# Patient Record
Sex: Female | Born: 1990 | Race: White | Hispanic: No | Marital: Single | Smoking: Current every day smoker
Health system: Southern US, Community
[De-identification: ages and names within clinical notes are randomized; demographics above are authoritative.]

## PROBLEM LIST (undated history)

## (undated) ENCOUNTER — Inpatient Hospital Stay (HOSPITAL_COMMUNITY): Payer: Self-pay

## (undated) ENCOUNTER — Emergency Department (HOSPITAL_COMMUNITY): Admission: EM | Payer: No Typology Code available for payment source | Source: Home / Self Care

## (undated) DIAGNOSIS — O1420 HELLP syndrome (HELLP), unspecified trimester: Secondary | ICD-10-CM

## (undated) DIAGNOSIS — O24419 Gestational diabetes mellitus in pregnancy, unspecified control: Secondary | ICD-10-CM

## (undated) DIAGNOSIS — R110 Nausea: Secondary | ICD-10-CM

## (undated) HISTORY — DX: Nausea: R11.0

## (undated) HISTORY — PX: ANKLE SURGERY: SHX546

## (undated) HISTORY — PX: FRACTURE SURGERY: SHX138

## (undated) HISTORY — PX: TUBAL LIGATION: SHX77

---

## 2011-03-08 ENCOUNTER — Encounter: Payer: Self-pay | Admitting: Emergency Medicine

## 2011-03-08 ENCOUNTER — Emergency Department (HOSPITAL_COMMUNITY)
Admission: EM | Admit: 2011-03-08 | Discharge: 2011-03-09 | Disposition: A | Discharge status: Home / Self Care | Payer: BC Managed Care – PPO | Attending: Emergency Medicine | Admitting: Emergency Medicine

## 2011-03-08 DIAGNOSIS — R0602 Shortness of breath: Secondary | ICD-10-CM | POA: Insufficient documentation

## 2011-03-08 DIAGNOSIS — J45909 Unspecified asthma, uncomplicated: Secondary | ICD-10-CM | POA: Insufficient documentation

## 2011-03-08 DIAGNOSIS — R05 Cough: Secondary | ICD-10-CM | POA: Insufficient documentation

## 2011-03-08 DIAGNOSIS — R059 Cough, unspecified: Secondary | ICD-10-CM | POA: Insufficient documentation

## 2011-03-08 MED ORDER — ALBUTEROL SULFATE (5 MG/ML) 0.5% IN NEBU
2.5000 mg | INHALATION_SOLUTION | Freq: Once | RESPIRATORY_TRACT | Status: AC
  Administered 2011-03-08: 5 mg via RESPIRATORY_TRACT
  Filled 2011-03-08: qty 1

## 2011-03-08 NOTE — ED Notes (Signed)
PT. REPORTS SOB WITH DRY COUGH AND WHEEZING ONSET THIS EVENING ,  RAN OUT OFALBUTEROL  MDI .

## 2011-03-09 MED ORDER — ALBUTEROL SULFATE HFA 108 (90 BASE) MCG/ACT IN AERS: 2.0000 | INHALATION_SPRAY | RESPIRATORY_TRACT | Status: DC | PRN

## 2011-03-09 MED ORDER — PREDNISONE 10 MG PO TABS: 40.0000 mg | ORAL_TABLET | Freq: Every day | ORAL | Status: AC

## 2011-03-09 MED ORDER — PREDNISONE 20 MG PO TABS
60.0000 mg | ORAL_TABLET | Freq: Once | ORAL | Status: AC
  Administered 2011-03-09: 60 mg via ORAL
  Filled 2011-03-09: qty 3

## 2011-03-09 MED ORDER — ALBUTEROL SULFATE HFA 108 (90 BASE) MCG/ACT IN AERS
2.0000 | INHALATION_SPRAY | RESPIRATORY_TRACT | Status: DC
  Administered 2011-03-09: 2 via RESPIRATORY_TRACT
  Filled 2011-03-09: qty 6.7

## 2011-03-09 NOTE — ED Provider Notes (Signed)
History     CSN: [id number] Arrival date & time: 03/08/2011 11:05 PM   First MD Initiated Contact with Patient 03/09/11 0058      Chief Complaint  Patient presents with  . Shortness of Breath    (Consider location/radiation/quality/duration/timing/severity/associated sxs/prior treatment) Patient is a 20 y.o. female presenting with shortness of breath. The history is provided by the patient.  Shortness of Breath  Associated symptoms include shortness of breath.   patient reports dry cough and mild shortness of breath this evening consistent with her prior asthma.  She however was out of her albuterol and therefore had none to take.  She presented the ER for evaluation and was given albuterol on arrival per nursing protocol.  She reports significant improvement in her breathing at this time.  No fever or chills.  No nausea vomiting or diarrhea.  No tobacco smoking.  Nothing worsens her symptoms.  Her symptoms have been improved by albuterol.  Her symptoms are mild  Past Medical History  Diagnosis Date  . Asthma     Past Surgical History  Procedure Date  . Ankle surgery     No family history on file.  History  Substance Use Topics  . Smoking status: Never Smoker   . Smokeless tobacco: Not on file  . Alcohol Use: No    OB History    Grav Para Term Preterm Abortions TAB SAB Ect Mult Living                  Review of Systems  Respiratory: Positive for shortness of breath.   All other systems reviewed and are negative.    Allergies  Review of patient's allergies indicates no known allergies.  Home Medications   Current Outpatient Rx  Name Route Sig Dispense Refill  . HYDROCODONE-ACETAMINOPHEN 7.5-325 MG PO TABS Oral Take 0.5 tablets by mouth every 6 (six) hours as needed. For dental pain     . IBUPROFEN 800 MG PO TABS Oral Take 800 mg by mouth every 8 (eight) hours as needed. For pain       BP 139/71  Pulse 117  Temp 99.3 F (37.4 C)  Resp 20  SpO2 99%   LMP 02/19/2011  Physical Exam  Nursing note and vitals reviewed. Constitutional: She is oriented to person, place, and time. She appears well-developed and well-nourished. No distress.  HENT:  Head: Normocephalic and atraumatic.  Eyes: EOM are normal.  Neck: Normal range of motion.  Cardiovascular: Normal rate, regular rhythm and normal heart sounds.   Pulmonary/Chest: Effort normal. No respiratory distress. She has wheezes. She has no rales.  Abdominal: Soft. She exhibits no distension. There is no tenderness.  Musculoskeletal: Normal range of motion.  Neurological: She is alert and oriented to person, place, and time.  Skin: Skin is warm and dry.  Psychiatric: She has a normal mood and affect. Judgment normal.    ED Course  Procedures (including critical care time)  Labs Reviewed - No data to display No results found.   1. Asthma       MDM  Well-appearing.  Likely asthma exacerbation.  Improved with albuterol in the emergency department.  Home with albuterol and prednisone        Lyanne Co, MD 03/09/11 [phone]

## 2011-04-25 NOTE — L&D Delivery Note (Signed)
Delivery Note At 10:35 PM a viable female was delivered via Vaginal, Spontaneous Delivery (Presentation: Left Occiput Anterior).  APGAR: 8, 8; weight 5 lb 9.2 oz (2530 g).   Placenta status: Intact, Spontaneous.  Cord: 3 vessels with the following complications: None.  Cord pH: n/a Foley catheter placed with sterile technique.  Pt with axillary temperature 100.9.  Placenta to path.  Anesthesia: Epidural  Episiotomy: None Lacerations: Vaginal Suture Repair: 2.0 3.0  Chromic Est. Blood Loss (mL): 200  Mom to AICU.  Baby to nursery-stable for observation due to difficulty breathing.  Mom with petechia on her face. CV:  RRR, Lungs: CTA bilaterally, no crackles, rales; Ext: 1+ pitting edema, DTR 2-3+ HELLP labs and DIC panel now.    To AICU to monitor closely. Check magnesium level. Resume magnesium sulfate if low.  Geryl Rankins 11/29/2011, 11:24 PM

## 2011-05-31 ENCOUNTER — Other Ambulatory Visit (HOSPITAL_COMMUNITY)
Admission: RE | Admit: 2011-05-31 | Discharge: 2011-05-31 | Disposition: A | Discharge status: Home / Self Care | Payer: BC Managed Care – PPO | Source: Ambulatory Visit | Attending: Obstetrics and Gynecology | Admitting: Obstetrics and Gynecology

## 2011-05-31 DIAGNOSIS — Z113 Encounter for screening for infections with a predominantly sexual mode of transmission: Secondary | ICD-10-CM | POA: Insufficient documentation

## 2011-05-31 DIAGNOSIS — Z01419 Encounter for gynecological examination (general) (routine) without abnormal findings: Secondary | ICD-10-CM | POA: Insufficient documentation

## 2011-06-06 LAB — OB RESULTS CONSOLE RUBELLA ANTIBODY, IGM: Rubella: IMMUNE

## 2011-06-06 LAB — OB RESULTS CONSOLE HEPATITIS B SURFACE ANTIGEN: Hepatitis B Surface Ag: NEGATIVE

## 2011-06-06 LAB — OB RESULTS CONSOLE HIV ANTIBODY (ROUTINE TESTING): HIV: NONREACTIVE

## 2011-11-09 LAB — OB RESULTS CONSOLE ANTIBODY SCREEN: Antibody Screen: NEGATIVE

## 2011-11-22 LAB — OB RESULTS CONSOLE GBS: GBS: NEGATIVE

## 2011-11-23 ENCOUNTER — Inpatient Hospital Stay (HOSPITAL_COMMUNITY)
Admission: AD | Admit: 2011-11-23 | Discharge: 2011-12-02 | DRG: 372 | Disposition: A | Discharge status: Home / Self Care | Payer: BC Managed Care – PPO | Source: Ambulatory Visit | Attending: Obstetrics & Gynecology | Admitting: Obstetrics & Gynecology

## 2011-11-23 ENCOUNTER — Encounter (HOSPITAL_COMMUNITY): Payer: Self-pay | Admitting: *Deleted

## 2011-11-23 ENCOUNTER — Inpatient Hospital Stay (HOSPITAL_COMMUNITY)
Admit: 2011-11-23 | Discharge: 2011-11-23 | Disposition: A | Discharge status: Home / Self Care | Payer: BC Managed Care – PPO | Attending: Obstetrics & Gynecology | Admitting: Obstetrics & Gynecology

## 2011-11-23 ENCOUNTER — Ambulatory Visit (HOSPITAL_COMMUNITY)
Admit: 2011-11-23 | Discharge: 2011-11-23 | Disposition: A | Discharge status: Home / Self Care | Payer: BC Managed Care – PPO | Attending: Obstetrics and Gynecology | Admitting: Obstetrics and Gynecology

## 2011-11-23 DIAGNOSIS — O99814 Abnormal glucose complicating childbirth: Secondary | ICD-10-CM | POA: Diagnosis present

## 2011-11-23 DIAGNOSIS — D696 Thrombocytopenia, unspecified: Secondary | ICD-10-CM | POA: Diagnosis present

## 2011-11-23 DIAGNOSIS — D689 Coagulation defect, unspecified: Secondary | ICD-10-CM | POA: Diagnosis present

## 2011-11-23 DIAGNOSIS — O1414 Severe pre-eclampsia complicating childbirth: Principal | ICD-10-CM | POA: Diagnosis not present

## 2011-11-23 HISTORY — DX: Gestational diabetes mellitus in pregnancy, unspecified control: O24.419

## 2011-11-23 LAB — HEMOGLOBIN A1C
Hgb A1c MFr Bld: 5.8 % — ABNORMAL HIGH (ref <5.7)
Mean Plasma Glucose: 120 mg/dL — ABNORMAL HIGH (ref <117)

## 2011-11-23 LAB — COMPREHENSIVE METABOLIC PANEL
ALT: 109 U/L — ABNORMAL HIGH (ref 0–35)
AST: 96 U/L — ABNORMAL HIGH (ref 0–37)
AST: 75 U/L — ABNORMAL HIGH (ref 0–37)
Albumin: 2.6 g/dL — ABNORMAL LOW (ref 3.5–5.2)
Albumin: 2.5 g/dL — ABNORMAL LOW (ref 3.5–5.2)
Alkaline Phosphatase: 210 U/L — ABNORMAL HIGH (ref 39–117)
CO2: 20 mEq/L (ref 19–32)
Calcium: 9 mg/dL (ref 8.4–10.5)
Calcium: 9 mg/dL (ref 8.4–10.5)
Creatinine, Ser: 0.69 mg/dL (ref 0.50–1.10)
GFR calc non Af Amer: >90 mL/min (ref >90)
Glucose, Bld: 94 mg/dL (ref 70–99)
Potassium: 4.1 mEq/L (ref 3.5–5.1)
Sodium: 138 mEq/L (ref 135–145)
Total Protein: 5.9 g/dL — ABNORMAL LOW (ref 6.0–8.3)

## 2011-11-23 LAB — CBC
Hemoglobin: 10.9 g/dL — ABNORMAL LOW (ref 12.0–15.0)
Hemoglobin: 10.4 g/dL — ABNORMAL LOW (ref 12.0–15.0)
MCH: 29 pg (ref 26.0–34.0)
MCHC: 32.7 g/dL (ref 30.0–36.0)
MCHC: 32.2 g/dL (ref 30.0–36.0)
Platelets: 121 10*3/uL — ABNORMAL LOW (ref 150–400)
Platelets: 123 10*3/uL — ABNORMAL LOW (ref 150–400)
RBC: 3.76 MIL/uL — ABNORMAL LOW (ref 3.87–5.11)
RDW: 14.1 % (ref 11.5–15.5)

## 2011-11-23 LAB — URINALYSIS, ROUTINE W REFLEX MICROSCOPIC
Ketones, ur: NEGATIVE mg/dL
Nitrite: NEGATIVE
Urobilinogen, UA: 0.2 mg/dL (ref 0.0–1.0)
pH: 6.5 (ref 5.0–8.0)

## 2011-11-23 LAB — GLUCOSE, CAPILLARY: Glucose-Capillary: 92 mg/dL (ref 70–99)

## 2011-11-23 LAB — RPR: RPR Ser Ql: NONREACTIVE

## 2011-11-23 LAB — URINE MICROSCOPIC-ADD ON

## 2011-11-23 MED ORDER — LACTATED RINGERS IV SOLN: 500.0000 mL | INTRAVENOUS | Status: DC | PRN

## 2011-11-23 MED ORDER — ACETAMINOPHEN 325 MG PO TABS: 650.0000 mg | ORAL_TABLET | ORAL | Status: DC | PRN

## 2011-11-23 MED ORDER — LIDOCAINE HCL (PF) 1 % IJ SOLN: 30.0000 mL | INTRAMUSCULAR | Status: DC | PRN

## 2011-11-23 MED ORDER — FLEET ENEMA 7-19 GM/118ML RE ENEM: 1.0000 | ENEMA | RECTAL | Status: DC | PRN

## 2011-11-23 MED ORDER — LACTATED RINGERS IV SOLN: INTRAVENOUS | Status: DC

## 2011-11-23 MED ORDER — ZOLPIDEM TARTRATE 5 MG PO TABS: 5.0000 mg | ORAL_TABLET | Freq: Every evening | ORAL | Status: DC | PRN

## 2011-11-23 MED ORDER — IBUPROFEN 600 MG PO TABS: 600.0000 mg | ORAL_TABLET | Freq: Four times a day (QID) | ORAL | Status: DC | PRN

## 2011-11-23 MED ORDER — PRENATAL MULTIVITAMIN CH
1.0000 | ORAL_TABLET | Freq: Every day | ORAL | Status: DC
  Administered 2011-11-24 – 2011-11-26 (×3): 1 via ORAL
  Filled 2011-11-23 (×3): qty 1

## 2011-11-23 MED ORDER — CITRIC ACID-SODIUM CITRATE 334-500 MG/5ML PO SOLN: 30.0000 mL | ORAL | Status: DC | PRN

## 2011-11-23 MED ORDER — OXYTOCIN 40 UNITS IN LACTATED RINGERS INFUSION - SIMPLE MED: 62.5000 mL/h | Freq: Once | INTRAVENOUS | Status: DC

## 2011-11-23 MED ORDER — OXYTOCIN BOLUS FROM INFUSION
250.0000 mL | Freq: Once | INTRAVENOUS | Status: DC
  Filled 2011-11-23: qty 500

## 2011-11-23 MED ORDER — ONDANSETRON HCL 4 MG/2ML IJ SOLN: 4.0000 mg | Freq: Four times a day (QID) | INTRAMUSCULAR | Status: DC | PRN

## 2011-11-23 MED ORDER — OXYCODONE-ACETAMINOPHEN 5-325 MG PO TABS: 1.0000 | ORAL_TABLET | ORAL | Status: DC | PRN

## 2011-11-23 NOTE — Progress Notes (Signed)
Pt transferred to room 156 via wheelchair, report given to Allison,RN.

## 2011-11-23 NOTE — Progress Notes (Signed)
Explained plan of care and consultation with MFM, pt  Verbalized understanding.

## 2011-11-23 NOTE — Progress Notes (Signed)
Pt transferred to MFM by wheelchair, vital signs-WNL.

## 2011-11-23 NOTE — Progress Notes (Signed)
Pt returned from MFM, monitors applied and assessing, vital signs obtained, lab notified to come draw labs.

## 2011-11-23 NOTE — Progress Notes (Signed)
MFM Note  Nicole Moon is a 21 year old G1 Caucasian female at 35+5 weeks who was admitted last night due thrombocytopenia and elevated liver enzymes. Nicole Moon reports that her pregnancy had been uncomplicated except for a recent diagnosis of gestational diabetes. She states that during her 3 hour OGTT other labs were drawn. Her platelets returned mildly decreased at 109K. For follow-up, labs were repeated yesterday and her platelets had increased slightly to 114K; however, the liver enzymes were elevated at AST 85 and ALT 122. Upon admission, another set were drawn showing further improvement of the platelet count (121) but the liver enzymes were slightly higher (96,122). This afternoon the platelet count was 123K. Her H/H was 10/33 and the urinalysis was negative for protein. Since admission, her BPs have all been completely within the normal range. She denies headache, change in vision and abdominal pain. She reports experiencing some nausea over the past three days in the evening. Fetal movement has remained normal.   Ultrasound today: singleton in cephalic position; no gross abnormalities; normal amniotic fluid volume; EFW at the 64th %tile; UA dopplers were normal and a BPP was 8/8.  No other medical or surgical conditions; no known drug allergies; denies tobacco, ETOH and drug use or abuse; no known congenital anomalies in family and no inheritable disorders that are pertinent to this pregnancy  Assessment: 1) IUP at 35+5 weeks 2) Abnormal platelet count and liver enzymes that are worrisome for HELLP syndrome (she would be in the ~15% that experience HELLP syndrome without hypertension or proteinuria) 3) Newly diagnosed gestational diabetes 4) Fetus - well grown with reassuring testing  Recommendations 1) Continue 24 hour urine collection 2) Serial platelet counts, LFTs and H/Hs 3) Observe closely for s/s of preeclampsia 4) Consider obtaining a peripheral smear looking for schistocytes, LDH  and total bilirubin 5) Usual protocol for GDM  Nicole Moon and I had a lengthy discussion regarding her thrombocytopenia and elevated LFTs. These findings are suspicious for HELLP syndrome even though she is normotensive and asymptomatic. I would not consider delivery at this point but hope she declares herself one way or another in the near future.   Thank you for referring Nicole Moon to [place]. I will continue to follow with you.  (Face-to-face consultation with patient: 30 min)

## 2011-11-23 NOTE — Progress Notes (Signed)
24 urine explained to pt and started, pt verbalized understanding.

## 2011-11-23 NOTE — Progress Notes (Signed)
Notified of pt's lab results, blood pressures, received orders to transfer pt to Antenatal unit with orders for labs in am.

## 2011-11-23 NOTE — H&P (Addendum)
Nicole Moon is a 21 y.o. female G1P0 at 35 wks and 5 days based on lmp on 03/18/2011 confirmed by 11 wk u/s with EDD 8/31/ 2013.  Her primary OB is DR. Varando. She has had limited prenatal care due to missed appointments and noncompliance . She has a diagnosis of gestational diabetes diagnosed at 33 wks. She has seen endocrinology however she has not gone through diabetic  She was noted to have platelets in the office last wk of 109. She had HELLP  labs drawn in the office by Dr. Dion Body yesterday and her platelets were 114. AST was 85. ALT was 114. Uric acid was 8.2.  Her bp was normal in the office yesterday. She was asked to come in for evaluation.  She denies headache blurred vision or ruq pain. She had a headache yesterday but none today. She had an u/s on 7/24 and the efw was 5 lbs 3 oz.    Maternal Medical History:  Fetal activity: Perceived fetal activity is normal.   Last perceived fetal movement was within the past hour.      OB History    Grav Para Term Preterm Abortions TAB SAB Ect Mult Living   1         0     Past Medical History  Diagnosis Date  . Asthma   . Gestational diabetes    Past Surgical History  Procedure Date  . Ankle surgery   . Fracture surgery    Family History: family history includes Alcohol abuse in her paternal grandfather and Heart disease in her father and mother. Social History:  reports that she has never smoked. She does not have any smokeless tobacco history on file. She reports that she does not drink alcohol or use illicit drugs.   Prenatal Transfer Tool  Maternal Diabetes: Yes:  Diabetes Type:  Diet controlled Genetic Screening: Declined Maternal Ultrasounds/Referrals: Normal Fetal Ultrasounds or other Referrals:  None Maternal Substance Abuse:  No Significant Maternal Medications:  None Significant Maternal Lab Results:  Lab values include: Other: see prenatal record Other Comments:  pt noncompliant with appointments and prenatal  care   Review of Systems  All other systems reviewed and are negative.      Blood pressure 109/49, pulse 77, temperature 98.4 F (36.9 C), temperature source Oral, resp. rate 18, last menstrual period 03/18/2011. Maternal Exam:  Abdomen: Patient reports no abdominal tenderness. Fetal presentation: vertex  Introitus: Normal vulva. Normal vagina.  Pelvis: adequate for delivery.   Cervix: Cervix evaluated by digital exam.     Physical Exam  Constitutional: She is oriented to person, place, and time. She appears well-developed and well-nourished.  Cardiovascular: Normal rate.   Respiratory: Breath sounds normal.  Musculoskeletal: Normal range of motion. She exhibits no edema and no tenderness.  Neurological: She is oriented to person, place, and time.    FHR baseline 140's good btbv +accels no decels. REactive Category 1 Toco no contractions  Prenatal labs: ABO, Rh:   Antibody:   Rubella: Immune (02/12 0000) RPR:    HBsAg: Negative (02/12 0000)  HIV: Non-reactive (02/12 0000)  GBS:   Unknown  Assessment/Plan: 35 wks and 5 days with thrompocytopenia and elevated liver enzymes.Possible HELLP syndrome. Clinically patient is stable currently  24 hr urine  MFM consult.  Gestational diabetes. Carb modified diet. cbg fasting and 2hr pp     Korynne Dols J. 11/23/2011, 8:06 AM

## 2011-11-24 LAB — COMPREHENSIVE METABOLIC PANEL
ALT: 98 U/L — ABNORMAL HIGH (ref 0–35)
ALT: 105 U/L — ABNORMAL HIGH (ref 0–35)
AST: 71 U/L — ABNORMAL HIGH (ref 0–37)
Alkaline Phosphatase: 216 U/L — ABNORMAL HIGH (ref 39–117)
BUN: 5 mg/dL — ABNORMAL LOW (ref 6–23)
CO2: 21 mEq/L (ref 19–32)
CO2: 23 mEq/L (ref 19–32)
Calcium: 9.2 mg/dL (ref 8.4–10.5)
Calcium: 9.2 mg/dL (ref 8.4–10.5)
GFR calc Af Amer: >90 mL/min (ref >90)
GFR calc Af Amer: >90 mL/min (ref >90)
GFR calc non Af Amer: >90 mL/min (ref >90)
Glucose, Bld: 86 mg/dL (ref 70–99)
Glucose, Bld: 93 mg/dL (ref 70–99)
Potassium: 3.8 mEq/L (ref 3.5–5.1)
Sodium: 139 mEq/L (ref 135–145)
Total Protein: 5.6 g/dL — ABNORMAL LOW (ref 6.0–8.3)
Total Protein: 5.4 g/dL — ABNORMAL LOW (ref 6.0–8.3)

## 2011-11-24 LAB — GLUCOSE, CAPILLARY
Glucose-Capillary: 76 mg/dL (ref 70–99)
Glucose-Capillary: 108 mg/dL — ABNORMAL HIGH (ref 70–99)
Glucose-Capillary: 124 mg/dL — ABNORMAL HIGH (ref 70–99)

## 2011-11-24 LAB — CBC
HCT: 32.4 % — ABNORMAL LOW (ref 36.0–46.0)
HCT: 31.9 % — ABNORMAL LOW (ref 36.0–46.0)
Hemoglobin: 10.5 g/dL — ABNORMAL LOW (ref 12.0–15.0)
Hemoglobin: 10.4 g/dL — ABNORMAL LOW (ref 12.0–15.0)
MCV: 89 fL (ref 78.0–100.0)
RBC: 3.64 MIL/uL — ABNORMAL LOW (ref 3.87–5.11)
RDW: 14.2 % (ref 11.5–15.5)
WBC: 10.3 10*3/uL (ref 4.0–10.5)
WBC: 10.9 10*3/uL — ABNORMAL HIGH (ref 4.0–10.5)

## 2011-11-24 LAB — CREATININE CLEARANCE, URINE, 24 HOUR
Creatinine, Urine: 69.91 mg/dL
Creatinine: 0.7 mg/dL (ref 0.50–1.10)
Urine Total Volume-CRCL: 1900 mL

## 2011-11-24 LAB — LACTATE DEHYDROGENASE: LDH: 149 U/L (ref 94–250)

## 2011-11-24 LAB — PROTEIN, URINE, 24 HOUR: Protein, 24H Urine: 114 mg/d — ABNORMAL HIGH (ref 50–100)

## 2011-11-24 NOTE — Progress Notes (Signed)
35 6/[redacted] weeks gestation, with R/O HELLP, gestational Diabetes.  Height  66" Weight 232 Lbs pre-pregnancy weight 223 Lbs at 10 weeks.Pre-pregnancy  BMI 36  IBW 130 Lbs  Total weight gain 9 Lbs. Weight gain goals 11-20bs.   Estimated needs: 21-2300 kcal/day, 70-85  grams protein/day, 2.5 liters fluid/day Carbohydrate modified gestational diet tolerated well, appetite good. Current diet prescription will provide for increased needs. No abnormal nutrition related labs.  CBG (last 3)   Basename 11/24/11 0820 11/23/11 2145 11/23/11 1128  GLUCAP 76 104* 92     Nutrition Dx: Increased nutrient needs r/t pregnancy and fetal growth requirements aeb [redacted] weeks gestation.  No educational needs assessed at this time. Basic parameters of carb mod gestational diet reviewed with pt. Copy of diet education given to pt.

## 2011-11-24 NOTE — Progress Notes (Signed)
Pt denies headache blurred vision or ruq pain. She reports slight nausea no emesis. She has + FM . No ctx no lof no vaginal bleeding.  AF VSS CV rrr Lungs Clear  abd gravid nontender Ext 2 + reflexes 1 + edema bilaterally  FHR baseline 130's reactive Category 1 tracing.    plts this am 112  Liver enzymes improved slightly  U/s yesterday reviewed.. Normal growth 6 lbs 1 os with normal dopplers and BPP 8/8  24 hr urine protein is pending.    A/P 35 wks and 6 days admitted due to thrombocytopenia and elevated liver enzymes worrisome for possible HELLP.  Pt has been normotensive and UA is without protein.  Appreciate the MFM consult.   Gestational diabetes.. Diet controlled currently  Will continue to monitor closely with serial HELLP labs  Dr. Neva Seat covering 11/24/2011 starting at 5 pm.

## 2011-11-24 NOTE — Progress Notes (Signed)
UR Chart review completed.  

## 2011-11-24 NOTE — Clinical SW OB High Risk (Signed)
°  °  Clinical Social Work Department ANTENATAL PSYCHOSOCIAL ASSESSMENT 11/24/2011  Patient:  Nicole Moon, Nicole Moon   Account Number:  [id number]  Admit Date:  11/23/2011     DOB:  1990-10-18   Age:  21 Gestational age on admission:  36     Expected delivery date:  12/23/2011 Admitting diagnosis:   Possible HELLP syndrome    Clinical Social Worker:  Lulu Riding,  [place]  Date/Time:  11/24/2011 03:40 PM  FAMILY/HOME ENVIRONMENT  Home address:   [address], [place], [place] [address]   Household Member/Support Name Relationship Age  Nicole Moon MOTHER    FATHER    Other support:   Nicole Moon-Significant other/FOB     PSYCHOSOCIAL DATA  Information source:  Patient Interview Other information source:   FOB    Resources:   Employment:   MOB works at [employer]  FOB works at [employer] (county):  [employer]:     Current grade:    Homebound arranged?      Cultural/Environmental issues impacting care:   none known    STRENGTHS / WEAKNESSES / FACTORS TO CONSIDER  Concerns related to hospitalization:   Patient and FOB are very calm and seem to be handling the situation well.  They both agree that it does not help matters to be anxious or worried about the situation.   Previous pregnancies/feelings towards pregnancy?  Concerns related to being/becoming a mother?   Both parents are very excited about baby Ry'Leigh's arrival.  This is patient's first pregnancy.  They state no concerns at this time other than not having a breastpump.   Social support (FOB? Who is/will be helping with baby/other kids)   FOB was here and seems very invovled.  He, however, discouraged patient from trying to breastfeed once the baby is born when patient stated that she wants to breastfeed. SW encouraged him to support patient's desire to breastfeed and briefly stated some benefits.  SW explained that there are very helpful lactation consultants here once baby is born.    Couples relationship:   They appear to be happy together and excited about the baby.  They report both of their families being supportive and involved.   Recent stressful life events (life changes in past year?):   None stated.   Prenatal care/education/home preparations?   Patient states they have everything for baby at home.   Domestic violence (of any type):  N If yes to domestic violence describe/action plan:  Substance use during pregnancy.  (If YES, complete SBIRT):  N  Complete PHQ-9 (Depression Screening) on all antenatal patients.  PHQ-9 score:    (IF SCORE => 15 complete TREAT)  Follow up recommendations:   SW recommends calling WIC to apply.   Patient advised/response?   Patient was appreciative.   Other:    Clinical Assessment/Plan SW met with patient to complete assessment for need for community resources.  The only thing patient states she needs is a breastpump and to apply for [facility].  She asked SW if she qualifies for [employer].  SW does not know the eligibility guidelines and told patient that she would have to call the Department of Social Services.  SW provided phone numbers for [facility] and DSS.

## 2011-11-25 LAB — COMPREHENSIVE METABOLIC PANEL
ALT: 91 U/L — ABNORMAL HIGH (ref 0–35)
Albumin: 2.4 g/dL — ABNORMAL LOW (ref 3.5–5.2)
Alkaline Phosphatase: 215 U/L — ABNORMAL HIGH (ref 39–117)
Alkaline Phosphatase: 217 U/L — ABNORMAL HIGH (ref 39–117)
BUN: 8 mg/dL (ref 6–23)
CO2: 21 mEq/L (ref 19–32)
Calcium: 9.3 mg/dL (ref 8.4–10.5)
Chloride: 106 mEq/L (ref 96–112)
Creatinine, Ser: 0.72 mg/dL (ref 0.50–1.10)
GFR calc Af Amer: >90 mL/min (ref >90)
GFR calc Af Amer: >90 mL/min (ref >90)
GFR calc non Af Amer: >90 mL/min (ref >90)
Glucose, Bld: 73 mg/dL (ref 70–99)
Glucose, Bld: 127 mg/dL — ABNORMAL HIGH (ref 70–99)
Potassium: 3.9 mEq/L (ref 3.5–5.1)
Potassium: 3.8 mEq/L (ref 3.5–5.1)
Sodium: 136 mEq/L (ref 135–145)
Total Bilirubin: 0.3 mg/dL (ref 0.3–1.2)
Total Protein: 5.8 g/dL — ABNORMAL LOW (ref 6.0–8.3)

## 2011-11-25 LAB — GLUCOSE, CAPILLARY
Glucose-Capillary: 88 mg/dL (ref 70–99)
Glucose-Capillary: 89 mg/dL (ref 70–99)

## 2011-11-25 LAB — CBC
HCT: 32.5 % — ABNORMAL LOW (ref 36.0–46.0)
HCT: 31.6 % — ABNORMAL LOW (ref 36.0–46.0)
MCH: 28.8 pg (ref 26.0–34.0)
MCHC: 32.9 g/dL (ref 30.0–36.0)
MCV: 88.3 fL (ref 78.0–100.0)
Platelets: 111 10*3/uL — ABNORMAL LOW (ref 150–400)
Platelets: 107 10*3/uL — ABNORMAL LOW (ref 150–400)
RBC: 3.68 MIL/uL — ABNORMAL LOW (ref 3.87–5.11)
RDW: 14.2 % (ref 11.5–15.5)
WBC: 12.8 10*3/uL — ABNORMAL HIGH (ref 4.0–10.5)
WBC: 12.3 10*3/uL — ABNORMAL HIGH (ref 4.0–10.5)

## 2011-11-25 NOTE — Progress Notes (Signed)
Hospital Day: 3  S: Preterm labor symptoms: none  No c/o headache epigastric pain, or visual disturbances  O: Blood pressure 119/59, pulse 76, temperature 98.4 F (36.9 C), temperature source Oral, resp. rate 18, height 5\' 6"  (1.676 m), weight 232 lb (105.235 kg), last menstrual period 03/18/2011.   FHT:wnl Toco: No evidence of labor SVE: Na  A/P- 21 y.o. admitted with Elevated liver enzymes. @4  hour urine wnl ALt and AST dower on 8/2  Assessment Improving on bedrest P Continue present plan. :  Patient Active Hospital Problem List: No active hospital problems.   Pregnancy Complications: elevated liver enzymes, decreased platelets  Preterm labor management: no treatment necessary Dating:  [redacted]w[redacted]d PNL Needed:  none

## 2011-11-26 LAB — COMPREHENSIVE METABOLIC PANEL
ALT: 86 U/L — ABNORMAL HIGH (ref 0–35)
Albumin: 2.4 g/dL — ABNORMAL LOW (ref 3.5–5.2)
Alkaline Phosphatase: 215 U/L — ABNORMAL HIGH (ref 39–117)
BUN: 10 mg/dL (ref 6–23)
CO2: 21 mEq/L (ref 19–32)
Chloride: 104 mEq/L (ref 96–112)
Chloride: 102 mEq/L (ref 96–112)
Creatinine, Ser: 0.71 mg/dL (ref 0.50–1.10)
GFR calc Af Amer: >90 mL/min (ref >90)
GFR calc Af Amer: >90 mL/min (ref >90)
GFR calc non Af Amer: >90 mL/min (ref >90)
Glucose, Bld: 82 mg/dL (ref 70–99)
Potassium: 3.6 mEq/L (ref 3.5–5.1)
Sodium: 135 mEq/L (ref 135–145)
Total Bilirubin: 0.3 mg/dL (ref 0.3–1.2)
Total Bilirubin: 0.3 mg/dL (ref 0.3–1.2)
Total Protein: 5.6 g/dL — ABNORMAL LOW (ref 6.0–8.3)

## 2011-11-26 LAB — GLUCOSE, CAPILLARY
Glucose-Capillary: 83 mg/dL (ref 70–99)
Glucose-Capillary: 77 mg/dL (ref 70–99)
Glucose-Capillary: 118 mg/dL — ABNORMAL HIGH (ref 70–99)

## 2011-11-26 LAB — CBC
HCT: 32.3 % — ABNORMAL LOW (ref 36.0–46.0)
Hemoglobin: 11 g/dL — ABNORMAL LOW (ref 12.0–15.0)
MCH: 29.3 pg (ref 26.0–34.0)
MCHC: 32.9 g/dL (ref 30.0–36.0)
MCV: 88.8 fL (ref 78.0–100.0)
Platelets: 98 10*3/uL — ABNORMAL LOW (ref 150–400)
RBC: 3.64 MIL/uL — ABNORMAL LOW (ref 3.87–5.11)
RBC: 3.76 MIL/uL — ABNORMAL LOW (ref 3.87–5.11)
RDW: 14.2 % (ref 11.5–15.5)
WBC: 13.8 10*3/uL — ABNORMAL HIGH (ref 4.0–10.5)

## 2011-11-26 NOTE — Progress Notes (Signed)
Hospital Day: 4  S: Preterm labor symptoms: none  O: Blood pressure 114/73, pulse 92, temperature 98.4 F (36.9 C), temperature source Oral, resp. rate 18, height 5\' 6"  (1.676 m), weight 232 lb (105.235 kg), last menstrual period 03/18/2011.   ZOX:WRUEAV ALT/AST decreasing.  Platelets decreased to 98.000 today. Toco: None SVE: NA  A/P- 21 y.o. admitted with low platelets and elevated liver enzymes P:  Continued bedrest today.  Definitive plan from Dr. Dion Body in the morning.   Patient Active Hospital Problem List: No active hospital problems.   Pregnancy Complications: Low platelets, elevated liver enzymes  Preterm labor management: none Dating:  [redacted]w[redacted]d

## 2011-11-27 LAB — CBC
HCT: 32 % — ABNORMAL LOW (ref 36.0–46.0)
HCT: 32.2 % — ABNORMAL LOW (ref 36.0–46.0)
MCH: 28.9 pg (ref 26.0–34.0)
MCHC: 32.8 g/dL (ref 30.0–36.0)
MCHC: 32.9 g/dL (ref 30.0–36.0)
MCV: 88.2 fL (ref 78.0–100.0)
Platelets: 101 10*3/uL — ABNORMAL LOW (ref 150–400)
RDW: 14.2 % (ref 11.5–15.5)
RDW: 14.3 % (ref 11.5–15.5)
WBC: 15 10*3/uL — ABNORMAL HIGH (ref 4.0–10.5)
WBC: 12.4 10*3/uL — ABNORMAL HIGH (ref 4.0–10.5)

## 2011-11-27 LAB — COMPREHENSIVE METABOLIC PANEL
AST: 54 U/L — ABNORMAL HIGH (ref 0–37)
Albumin: 2.5 g/dL — ABNORMAL LOW (ref 3.5–5.2)
Alkaline Phosphatase: 222 U/L — ABNORMAL HIGH (ref 39–117)
BUN: 9 mg/dL (ref 6–23)
BUN: 10 mg/dL (ref 6–23)
Creatinine, Ser: 0.73 mg/dL (ref 0.50–1.10)
Creatinine, Ser: 0.78 mg/dL (ref 0.50–1.10)
GFR calc Af Amer: >90 mL/min (ref >90)
Glucose, Bld: 81 mg/dL (ref 70–99)
Potassium: 3.6 mEq/L (ref 3.5–5.1)
Potassium: 4 mEq/L (ref 3.5–5.1)
Total Protein: 5.7 g/dL — ABNORMAL LOW (ref 6.0–8.3)
Total Protein: 6 g/dL (ref 6.0–8.3)

## 2011-11-27 LAB — GLUCOSE, CAPILLARY: Glucose-Capillary: 68 mg/dL — ABNORMAL LOW (ref 70–99)

## 2011-11-27 LAB — MAGNESIUM: Magnesium: 4.3 mg/dL — ABNORMAL HIGH (ref 1.5–2.5)

## 2011-11-27 LAB — LACTATE DEHYDROGENASE: LDH: 148 U/L (ref 94–250)

## 2011-11-27 MED ORDER — EPHEDRINE 5 MG/ML INJ: 10.0000 mg | INTRAVENOUS | Status: DC | PRN

## 2011-11-27 MED ORDER — OXYCODONE-ACETAMINOPHEN 5-325 MG PO TABS: 1.0000 | ORAL_TABLET | ORAL | Status: DC | PRN

## 2011-11-27 MED ORDER — FLEET ENEMA 7-19 GM/118ML RE ENEM: 1.0000 | ENEMA | RECTAL | Status: DC | PRN

## 2011-11-27 MED ORDER — OXYTOCIN 40 UNITS IN LACTATED RINGERS INFUSION - SIMPLE MED
1.0000 m[IU]/min | INTRAVENOUS | Status: DC
  Administered 2011-11-28: 6 m[IU]/min via INTRAVENOUS
  Administered 2011-11-28: 8 m[IU]/min via INTRAVENOUS
  Administered 2011-11-28: 4 m[IU]/min via INTRAVENOUS
  Administered 2011-11-28: 2 m[IU]/min via INTRAVENOUS
  Filled 2011-11-27: qty 1000

## 2011-11-27 MED ORDER — ONDANSETRON HCL 4 MG/2ML IJ SOLN: 4.0000 mg | Freq: Four times a day (QID) | INTRAMUSCULAR | Status: DC | PRN

## 2011-11-27 MED ORDER — MAGNESIUM SULFATE 40 G IN LACTATED RINGERS - SIMPLE
2.0000 g/h | INTRAVENOUS | Status: DC
  Administered 2011-11-28 – 2011-11-29 (×2): 2 g/h via INTRAVENOUS
  Filled 2011-11-27 (×3): qty 500

## 2011-11-27 MED ORDER — MAGNESIUM SULFATE BOLUS VIA INFUSION
4.0000 g | Freq: Once | INTRAVENOUS | Status: AC
  Administered 2011-11-27: 4 g via INTRAVENOUS
  Filled 2011-11-27: qty 500

## 2011-11-27 MED ORDER — LIDOCAINE HCL (PF) 1 % IJ SOLN
30.0000 mL | INTRAMUSCULAR | Status: DC | PRN
  Administered 2011-11-29: 30 mL via SUBCUTANEOUS
  Filled 2011-11-27: qty 30

## 2011-11-27 MED ORDER — MAGNESIUM SULFATE 40 MG/ML IJ SOLN: 4.0000 g | Freq: Once | INTRAMUSCULAR | Status: DC

## 2011-11-27 MED ORDER — DIPHENHYDRAMINE HCL 50 MG/ML IJ SOLN: 12.5000 mg | INTRAMUSCULAR | Status: DC | PRN

## 2011-11-27 MED ORDER — OXYTOCIN 40 UNITS IN LACTATED RINGERS INFUSION - SIMPLE MED
62.5000 mL/h | Freq: Once | INTRAVENOUS | Status: DC
  Filled 2011-11-27: qty 1000

## 2011-11-27 MED ORDER — LACTATED RINGERS IV SOLN: INTRAVENOUS | Status: DC

## 2011-11-27 MED ORDER — ACETAMINOPHEN 325 MG PO TABS
650.0000 mg | ORAL_TABLET | ORAL | Status: DC | PRN
  Administered 2011-11-28: 650 mg via ORAL
  Filled 2011-11-27: qty 2

## 2011-11-27 MED ORDER — TERBUTALINE SULFATE 1 MG/ML IJ SOLN: 0.2500 mg | Freq: Once | INTRAMUSCULAR | Status: AC | PRN

## 2011-11-27 MED ORDER — LACTATED RINGERS IV SOLN
500.0000 mL | Freq: Once | INTRAVENOUS | Status: AC
  Administered 2011-11-29: 1000 mL via INTRAVENOUS

## 2011-11-27 MED ORDER — CITRIC ACID-SODIUM CITRATE 334-500 MG/5ML PO SOLN
30.0000 mL | ORAL | Status: DC | PRN
  Filled 2011-11-27: qty 15

## 2011-11-27 MED ORDER — OXYTOCIN BOLUS FROM INFUSION
250.0000 mL | Freq: Once | INTRAVENOUS | Status: DC
  Filled 2011-11-27: qty 500

## 2011-11-27 MED ORDER — LACTATED RINGERS IV SOLN
500.0000 mL | INTRAVENOUS | Status: DC | PRN
  Administered 2011-11-29: 500 mL via INTRAVENOUS

## 2011-11-27 MED ORDER — MISOPROSTOL 25 MCG QUARTER TABLET
25.0000 ug | ORAL_TABLET | ORAL | Status: DC | PRN
  Administered 2011-11-27 – 2011-11-28 (×4): 25 ug via VAGINAL
  Filled 2011-11-27 (×5): qty 0.25

## 2011-11-27 MED ORDER — FENTANYL 2.5 MCG/ML BUPIVACAINE 1/10 % EPIDURAL INFUSION (WH - ANES)
14.0000 mL/h | INTRAMUSCULAR | Status: DC
  Administered 2011-11-29 (×3): 14 mL/h via EPIDURAL
  Filled 2011-11-27 (×4): qty 60

## 2011-11-27 MED ORDER — PHENYLEPHRINE 40 MCG/ML (10ML) SYRINGE FOR IV PUSH (FOR BLOOD PRESSURE SUPPORT): 80.0000 ug | PREFILLED_SYRINGE | INTRAVENOUS | Status: DC | PRN

## 2011-11-27 MED ORDER — IBUPROFEN 600 MG PO TABS: 600.0000 mg | ORAL_TABLET | Freq: Four times a day (QID) | ORAL | Status: DC | PRN

## 2011-11-27 MED ORDER — LACTATED RINGERS IV SOLN
INTRAVENOUS | Status: DC
  Administered 2011-11-29 (×2): via INTRAVENOUS

## 2011-11-27 MED ORDER — EPHEDRINE 5 MG/ML INJ
10.0000 mg | INTRAVENOUS | Status: DC | PRN
  Filled 2011-11-27: qty 4

## 2011-11-27 MED ORDER — MAGNESIUM SULFATE 50 % IJ SOLN: 2.0000 g | Freq: Once | INTRAVENOUS | Status: DC

## 2011-11-27 MED ORDER — ZOLPIDEM TARTRATE 5 MG PO TABS: 5.0000 mg | ORAL_TABLET | Freq: Every evening | ORAL | Status: DC | PRN

## 2011-11-27 MED ORDER — PHENYLEPHRINE 40 MCG/ML (10ML) SYRINGE FOR IV PUSH (FOR BLOOD PRESSURE SUPPORT)
80.0000 ug | PREFILLED_SYRINGE | INTRAVENOUS | Status: DC | PRN
  Filled 2011-11-27: qty 5

## 2011-11-27 NOTE — Plan of Care (Signed)
Dr. Dion Body called in- plan to induce labor. L&D charge RN informed; plan of care discussed with pt.

## 2011-11-27 NOTE — Progress Notes (Signed)
MFM note  Reviewed initial consult from Dr. Sherrie George - discussed patient with Dr. Dion Body.  Patient now at 36 2/7 weeks - LFTs and Plts stable over weekend.  She remains normotensive and 24-hr urine was < 300 mg/24 hrs.  So far the most likely explanation for lab abnormalities is early HELLP syndrome and some 15% of patients with HELLP syndrome will not manifest significant hypertension or proteinuria.  Given current gestational age, would recommend moving toward delivery.  Recommend Magnesium sulfate prophylaxis and q6-8 hr LFTs/ CBCs during labor induction.  Alpha Gula, MD

## 2011-11-27 NOTE — Progress Notes (Signed)
Ur chart review completed.  

## 2011-11-27 NOTE — Progress Notes (Signed)
Nicole Moon is a 21 y.o. G1P0000 at [redacted]w[redacted]d by LMP c/w 11 week ultrasound admitted transferred to [facility] for induction of labor to early HELLP syndrome.  Subjective: Pt without complaints.  Denies headaches, visual changes, SOB.  Pt reports uterine cramping but no RUQ pain.  No LOF.  Objective: BP 116/67  Pulse 89  Temp 98.4 F (36.9 C) (Oral)  Resp 20  Ht 5\' 6"  (1.676 m)  Wt 105.235 kg (232 lb)  BMI 37.45 kg/m2  LMP 03/18/2011   Total I/O In: 908.8 [P.O.:240; I.V.:668.8] Out: 300 [Urine:300]  FHT:  140s, reactive, no decels, good LTV UC:   Occasional SVE:   Internal os Closed but external oss slightly opened from this am.  Adequate pelvis.  Labs: Lab Results  Component Value Date   WBC 15.0* 11/27/2011   HGB 10.5* 11/27/2011   HCT 32.0* 11/27/2011   MCV 88.2 11/27/2011   PLT 103* 11/27/2011    Assessment / Plan: IUP at 36 2/7 weeks.  Induction of labor due to HELLP syndrome. On Magnesium sulfate for seizure prophylaxis.  No s/sxs of toxicity. Preeclampsia labs with Magnesium level due at 1800.  Recommend q 6-8 per MFM. If labs stable, pt not in labor, fetal status reassuring, allow light meal. Cytotec x 4 doses then Pitocin or Pitocin once 2 cm. Pt counseled prior to transfer on plan and indication for delivery.  Informed of risk of c-section due to unfavorable cervix. Proceed with c-section for non reassuring fetal status or rapidly declining maternal status.  All questions answered. Will check out to Dr. Christell Constant this evening. Labor: Cervical ripening with Cytotec Preeclampsia:  on magnesium sulfate and no signs or symptoms of toxicity Fetal Wellbeing:  Category I Pain Control:  N/a at this time I/D:  GBS negative. Anticipated MOD:  pending  Geryl Rankins 11/27/2011, 5:53 PM

## 2011-11-27 NOTE — Progress Notes (Signed)
Subjective: Assuming care, back from vacation HD#5 Patient without complaints.  States she had a headache yesterday but she took a nap and upon awakening it had resolved.  Denies visual changes, abdominal pain.  Active fetus.  Objective: Vital signs in last 24 hours: Temp:  [98.2 F (36.8 C)-98.5 F (36.9 C)] 98.2 F (36.8 C) (08/04 2340) Pulse Rate:  [82-93] 93  (08/04 2340) Resp:  [18-20] 18  (08/05 0109) BP: (100-118)/(49-73) 117/64 mmHg (08/04 2340)  Physical Exam:  General: alert, cooperative and no distress CV: RRR Lungs: CTA bilaterally, no wheezes, rhonci or crackles DVT Evaluation: No evidence of DVT seen on physical exam. Trace-1+ edema Neuro:  DTRs not illicited  Basename 11/27/11 0530 11/26/11 1723  HGB 10.5* 11.0*  HCT 32.0* 33.4*   Plts 103 from 112 from 98 LFTS (AST/ALT)  55/78 from 66/93 from 61/86 LDH 188 24 hour urine protein 114  EFM:  Reactive tracing with good variability, spontaneous decel x 1 (10 below baseline less than 1 minute), no contractions.  Assessment/Plan: IUP at 36 2/7 weeks, possible HELLP syndrome.  No s/sxs of preeclampsia.  Normal 24 urine protein. Reassuring fetal status. Elevated liver enzymes are trending down.  No RUQ pain. Thrombocytopenia.  Fluctuating but overall the same.   GDM.  Controlled with diet.  Cont current CBG surveillance. Appreciate coverage and MFM consult. Will discuss definitive recommendations for delivery, ie platelets less than 100 or deliver at 37 weeks, with MFM today. Cont current management for now. Geryl Rankins 11/27/2011, 8:12 AM

## 2011-11-28 LAB — COMPREHENSIVE METABOLIC PANEL
ALT: 76 U/L — ABNORMAL HIGH (ref 0–35)
ALT: 71 U/L — ABNORMAL HIGH (ref 0–35)
AST: 53 U/L — ABNORMAL HIGH (ref 0–37)
Alkaline Phosphatase: 237 U/L — ABNORMAL HIGH (ref 39–117)
Alkaline Phosphatase: 234 U/L — ABNORMAL HIGH (ref 39–117)
BUN: 8 mg/dL (ref 6–23)
CO2: 20 mEq/L (ref 19–32)
CO2: 21 mEq/L (ref 19–32)
Chloride: 104 mEq/L (ref 96–112)
Chloride: 104 mEq/L (ref 96–112)
Creatinine, Ser: 0.76 mg/dL (ref 0.50–1.10)
GFR calc Af Amer: >90 mL/min (ref >90)
GFR calc non Af Amer: >90 mL/min (ref >90)
GFR calc non Af Amer: >90 mL/min (ref >90)
Glucose, Bld: 101 mg/dL — ABNORMAL HIGH (ref 70–99)
Potassium: 3.7 mEq/L (ref 3.5–5.1)
Sodium: 135 mEq/L (ref 135–145)
Total Bilirubin: 0.4 mg/dL (ref 0.3–1.2)
Total Bilirubin: 0.4 mg/dL (ref 0.3–1.2)
Total Protein: 6.1 g/dL (ref 6.0–8.3)

## 2011-11-28 LAB — CBC
Hemoglobin: 10.8 g/dL — ABNORMAL LOW (ref 12.0–15.0)
MCH: 29.1 pg (ref 26.0–34.0)
MCH: 29.1 pg (ref 26.0–34.0)
MCHC: 33.1 g/dL (ref 30.0–36.0)
MCV: 87.2 fL (ref 78.0–100.0)
Platelets: 108 10*3/uL — ABNORMAL LOW (ref 150–400)
RDW: 14.2 % (ref 11.5–15.5)
RDW: 14.1 % (ref 11.5–15.5)
WBC: 12.2 10*3/uL — ABNORMAL HIGH (ref 4.0–10.5)

## 2011-11-28 LAB — CBC WITH DIFFERENTIAL/PLATELET
Basophils Absolute: 0 10*3/uL (ref 0.0–0.1)
Eosinophils Absolute: 0.1 10*3/uL (ref 0.0–0.7)
Eosinophils Relative: 0 % (ref 0–5)
Lymphs Abs: 3.1 10*3/uL (ref 0.7–4.0)
MCH: 29.1 pg (ref 26.0–34.0)
MCV: 87.8 fL (ref 78.0–100.0)
Neutrophils Relative %: 70 % (ref 43–77)
Platelets: 96 10*3/uL — ABNORMAL LOW (ref 150–400)
RBC: 3.68 MIL/uL — ABNORMAL LOW (ref 3.87–5.11)
RDW: 14.1 % (ref 11.5–15.5)
WBC: 13.4 10*3/uL — ABNORMAL HIGH (ref 4.0–10.5)

## 2011-11-28 LAB — MAGNESIUM: Magnesium: 5.2 mg/dL — ABNORMAL HIGH (ref 1.5–2.5)

## 2011-11-28 MED ORDER — TERBUTALINE SULFATE 1 MG/ML IJ SOLN: 0.2500 mg | Freq: Once | INTRAMUSCULAR | Status: AC | PRN

## 2011-11-28 MED ORDER — OXYTOCIN 40 UNITS IN LACTATED RINGERS INFUSION - SIMPLE MED
1.0000 m[IU]/min | INTRAVENOUS | Status: DC
  Administered 2011-11-29: 10 m[IU]/min via INTRAVENOUS

## 2011-11-28 MED ORDER — OXYTOCIN 40 UNITS IN LACTATED RINGERS INFUSION - SIMPLE MED: 1.0000 m[IU]/min | INTRAVENOUS | Status: DC

## 2011-11-28 MED ORDER — MISOPROSTOL 25 MCG QUARTER TABLET
25.0000 ug | ORAL_TABLET | ORAL | Status: DC | PRN
  Administered 2011-11-28: 25 ug via VAGINAL

## 2011-11-28 MED ORDER — MISOPROSTOL 25 MCG QUARTER TABLET: 25.0000 ug | ORAL_TABLET | ORAL | Status: DC

## 2011-11-28 NOTE — Plan of Care (Signed)
Pt has red spot on rt cheek just below eye, approx. 1 cm and raised- states she is not aware of any insect bite.

## 2011-11-28 NOTE — Progress Notes (Signed)
Reapplied Beacon monitor.

## 2011-11-28 NOTE — Plan of Care (Signed)
Red spot on face now 2 cm in diameter, slightly raised- denies itching, but states it is beginning to "bother" her.  Gave ice pack and offered Benadryl- pt declines Benadryl.

## 2011-11-28 NOTE — Progress Notes (Signed)
In to assess patient she reports headache. She also has a red spot that she noticed to day under her right eye. It is not pruritic. She denies ruq pain or sob.  AF VSS abd gravid nontender  Ext trace edema 1+ reflexes  cx 1.5 / 50/ -2 cytotec 25 mcg placed.  FHR category 1 tracing  toco ctx q 6 minutes   plt increased to 115 from 96  A/p 36 wks 3 days  Hellp syndrome  Cytotec for cervical ripening  Anticipate svd

## 2011-11-28 NOTE — Progress Notes (Signed)
In to assess patient. She denies headache/ visual disturbances or ruq pain. Contractions are mild. + FM  AFVSS CX 1/70/-2 Ext 1+ edema 1 + reflexes  plt 98 at 2 am   A/P 36 wks 3 days with suspected HELLP syndrome Continue magnesium sulfate Start pitocin  Anticipate SVD  Serial Labs

## 2011-11-29 ENCOUNTER — Encounter (HOSPITAL_COMMUNITY): Payer: Self-pay | Admitting: Anesthesiology

## 2011-11-29 ENCOUNTER — Encounter (HOSPITAL_COMMUNITY): Payer: Self-pay | Admitting: *Deleted

## 2011-11-29 ENCOUNTER — Inpatient Hospital Stay (HOSPITAL_COMMUNITY): Payer: BC Managed Care – PPO | Admitting: Anesthesiology

## 2011-11-29 LAB — COMPREHENSIVE METABOLIC PANEL
ALT: 64 U/L — ABNORMAL HIGH (ref 0–35)
ALT: 59 U/L — ABNORMAL HIGH (ref 0–35)
AST: 45 U/L — ABNORMAL HIGH (ref 0–37)
AST: 44 U/L — ABNORMAL HIGH (ref 0–37)
Albumin: 2.3 g/dL — ABNORMAL LOW (ref 3.5–5.2)
Albumin: 2.5 g/dL — ABNORMAL LOW (ref 3.5–5.2)
Albumin: 2.5 g/dL — ABNORMAL LOW (ref 3.5–5.2)
Alkaline Phosphatase: 230 U/L — ABNORMAL HIGH (ref 39–117)
Alkaline Phosphatase: 233 U/L — ABNORMAL HIGH (ref 39–117)
Alkaline Phosphatase: 225 U/L — ABNORMAL HIGH (ref 39–117)
BUN: 7 mg/dL (ref 6–23)
BUN: 8 mg/dL (ref 6–23)
BUN: 8 mg/dL (ref 6–23)
CO2: 21 mEq/L (ref 19–32)
Calcium: 8.3 mg/dL — ABNORMAL LOW (ref 8.4–10.5)
Chloride: 103 mEq/L (ref 96–112)
Chloride: 101 mEq/L (ref 96–112)
Chloride: 101 mEq/L (ref 96–112)
Creatinine, Ser: 0.74 mg/dL (ref 0.50–1.10)
Creatinine, Ser: 0.77 mg/dL (ref 0.50–1.10)
GFR calc Af Amer: >90 mL/min (ref >90)
GFR calc Af Amer: >90 mL/min (ref >90)
GFR calc non Af Amer: >90 mL/min (ref >90)
Glucose, Bld: 92 mg/dL (ref 70–99)
Glucose, Bld: 78 mg/dL (ref 70–99)
Potassium: 3.5 mEq/L (ref 3.5–5.1)
Potassium: 3.7 mEq/L (ref 3.5–5.1)
Potassium: 3.8 mEq/L (ref 3.5–5.1)
Sodium: 136 mEq/L (ref 135–145)
Sodium: 133 mEq/L — ABNORMAL LOW (ref 135–145)
Total Bilirubin: 0.4 mg/dL (ref 0.3–1.2)
Total Bilirubin: 0.4 mg/dL (ref 0.3–1.2)
Total Protein: 5.7 g/dL — ABNORMAL LOW (ref 6.0–8.3)
Total Protein: 6 g/dL (ref 6.0–8.3)
Total Protein: 6 g/dL (ref 6.0–8.3)

## 2011-11-29 LAB — CBC
HCT: 32.9 % — ABNORMAL LOW (ref 36.0–46.0)
HCT: 33.4 % — ABNORMAL LOW (ref 36.0–46.0)
Hemoglobin: 10.1 g/dL — ABNORMAL LOW (ref 12.0–15.0)
Hemoglobin: 10.5 g/dL — ABNORMAL LOW (ref 12.0–15.0)
Hemoglobin: 10.7 g/dL — ABNORMAL LOW (ref 12.0–15.0)
MCHC: 32.5 g/dL (ref 30.0–36.0)
MCHC: 32.9 g/dL (ref 30.0–36.0)
MCHC: 33.5 g/dL (ref 30.0–36.0)
MCV: 87.7 fL (ref 78.0–100.0)
Platelets: 100 10*3/uL — ABNORMAL LOW (ref 150–400)
RBC: 3.44 MIL/uL — ABNORMAL LOW (ref 3.87–5.11)
RDW: 14.1 % (ref 11.5–15.5)
RDW: 14.1 % (ref 11.5–15.5)
RDW: 14.2 % (ref 11.5–15.5)
WBC: 10 10*3/uL (ref 4.0–10.5)
WBC: 12.8 10*3/uL — ABNORMAL HIGH (ref 4.0–10.5)
WBC: 13 10*3/uL — ABNORMAL HIGH (ref 4.0–10.5)

## 2011-11-29 LAB — MAGNESIUM: Magnesium: 5.8 mg/dL — ABNORMAL HIGH (ref 1.5–2.5)

## 2011-11-29 MED ORDER — LIDOCAINE HCL (PF) 1 % IJ SOLN
INTRAMUSCULAR | Status: DC | PRN
  Administered 2011-11-29 (×2): 4 mL

## 2011-11-29 MED ORDER — FENTANYL 2.5 MCG/ML BUPIVACAINE 1/10 % EPIDURAL INFUSION (WH - ANES)
INTRAMUSCULAR | Status: DC | PRN
  Administered 2011-11-29: 14 mL/h via EPIDURAL

## 2011-11-29 MED ORDER — BUTORPHANOL TARTRATE 1 MG/ML IJ SOLN: 1.0000 mg | INTRAMUSCULAR | Status: DC | PRN

## 2011-11-29 MED ORDER — MISOPROSTOL 200 MCG PO TABS
ORAL_TABLET | ORAL | Status: AC
  Filled 2011-11-29: qty 4

## 2011-11-29 NOTE — Anesthesia Procedure Notes (Signed)
Epidural Patient location during procedure: OB Start time: 11/29/2011 8:35 AM  Staffing Anesthesiologist: Kihanna Kamiya A.  Preanesthetic Checklist Completed: patient identified, site marked, surgical consent, pre-op evaluation, timeout performed, IV checked, risks and benefits discussed and monitors and equipment checked  Epidural Patient position: sitting Prep: site prepped and draped and DuraPrep Patient monitoring: continuous pulse ox and blood pressure Approach: midline Injection technique: LOR air  Needle:  Needle type: Tuohy  Needle gauge: 17 G Needle length: 9 cm Needle insertion depth: 6 cm Catheter type: closed end flexible Catheter size: 19 Gauge Catheter at skin depth: 11 cm Test dose: negative and Other  Assessment Events: blood not aspirated, injection not painful, no injection resistance, negative IV test and no paresthesia  Additional Notes Patient identified. Risks and benefits discussed including failed block, incomplete  Pain control, post dural puncture headache, nerve damage, paralysis, blood pressure Changes, nausea, vomiting, reactions to medications-both toxic and allergic and post Partum back pain. All questions were answered. Patient expressed understanding and wished to proceed. Sterile technique was used throughout procedure. Epidural site was Dressed with sterile barrier dressing. No paresthesias, signs of intravascular injection Or signs of intrathecal spread were encountered.  Patient was more comfortable after the epidural was dosed. Please see RN's note for documentation of vital signs and FHR which are stable.

## 2011-11-29 NOTE — Progress Notes (Signed)
BELLANY ELBAUM is a 21 y.o. G1P0000 at [redacted]w[redacted]d   Subjective: Pt without complaints except being tired.  Comfortable with epidural  Objective: BP 135/68  Pulse 82  Temp 98.2 F (36.8 C) (Oral)  Resp 20  Ht 5\' 6"  (1.676 m)  Wt 105.235 kg (232 lb)  BMI 37.45 kg/m2  LMP 03/18/2011 I/O last 3 completed shifts: In: 7185.2 [P.O.:2770; I.V.:4415.2] Out: 4575 [Urine:4575] Total I/O In: 1071.6 [I.V.:1071.6] Out: 300 [Urine:300]  FHT:  Reactive no decels UC:   ctxn q 2-5 min  SVE:   Dilation: 4 Effacement (%): 80 Station: 0 Exam by:: Wilkin Lippy IUPC easily placed posteriorly  Labs: Lab Results  Component Value Date   WBC 12.5* 11/29/2011   HGB 10.7* 11/29/2011   HCT 32.9* 11/29/2011   MCV 87.7 11/29/2011   PLT 117* 11/29/2011  LFTS stable-40s/60s.    Assessment / Plan: 36 weeks, HELLP- Labs stable, plts improved.  BP slightly elevated.  Continue Magnesium Sulfate. Active labor. IUPC to aid with Pitocin. Anticipate SVD.  Geryl Rankins 11/29/2011, 2:01 PM

## 2011-11-29 NOTE — Progress Notes (Signed)
Nicole Moon is a 21 y.o. G1P0000 at [redacted]w[redacted]d  Subjective: Pt without complaints.  Denies headaches, visual changes, SOB.  + pelvic pressure  Objective: BP 139/78  Pulse 94  Temp 98.1 F (36.7 C) (Oral)  Resp 20  Ht 5\' 6"  (1.676 m)  Wt 105.235 kg (232 lb)  BMI 37.45 kg/m2  LMP 03/18/2011 I/O last 3 completed shifts: In: 8221.6 [P.O.:2710; I.V.:5511.6] Out: 3310 [Urine:3310]   UOP decreased to 15 cc in 1 hour.  200 cc after given IV fluid bolus of 500 ml  Gen:  NAD CV:  RRR Lungs: CTA bilaterally Abdomen:  No RUQ tenderness FHT:  Reactive UC:   regular, every 2-3 minutes SVE:   Dilation: 6.5 Effacement (%): 100 Station: +1 Exam by:: Bayan Hedstrom Bloody show  Labs: Lab Results  Component Value Date   WBC 13.0* 11/29/2011   HGB 11.0* 11/29/2011   HCT 33.4* 11/29/2011   MCV 87.9 11/29/2011   PLT 108* 11/29/2011   LFTS stable.  Cr. 0.86 Mg 5.8 at 1220 now 5.1 at 1751 Blood glucose WNL.  Assessment / Plan: IUP at 36 week, HELLP syndome.  Labs stable but UOP decreasing and BP occasionally elevated. Magnesium Sulfate held temporarily due to hypotonic contractions, decreased urine output and elevated magnesium level. Continue Pitocin.  Anticipated SVD but protracted labor anticipated due to magnesium. Continue labs q 6 hours.   Anticipated MOD:  NSVD  Daaiel Starlin 11/29/2011, 7:24 PM

## 2011-11-29 NOTE — Anesthesia Preprocedure Evaluation (Signed)
Anesthesia Evaluation  Patient identified by MRN, date of birth, ID band Patient awake    Reviewed: Allergy & Precautions, H&P , Patient's Chart, lab work & pertinent test results  Airway Mallampati: III TM Distance: >3 FB Neck ROM: full    Dental No notable dental hx. (+) Teeth Intact   Pulmonary asthma ,  breath sounds clear to auscultation  Pulmonary exam normal       Cardiovascular negative cardio ROS  Rhythm:regular Rate:Normal     Neuro/Psych negative neurological ROS  negative psych ROS   GI/Hepatic negative GI ROS, HELLP syndrome ?? Improved LFT's   Endo/Other  Well Controlled, GestationalMorbid obesity  Renal/GU negative Renal ROS  negative genitourinary   Musculoskeletal   Abdominal Normal abdominal exam  (+)   Peds  Hematology ? HELLP syndrome vs. Gestational Thrombocytopenia   Anesthesia Other Findings   Reproductive/Obstetrics (+) Pregnancy                           Anesthesia Physical Anesthesia Plan  ASA: II  Anesthesia Plan: Epidural   Post-op Pain Management:    Induction:   Airway Management Planned:   Additional Equipment:   Intra-op Plan:   Post-operative Plan:   Informed Consent: I have reviewed the patients History and Physical, chart, labs and discussed the procedure including the risks, benefits and alternatives for the proposed anesthesia with the patient or authorized representative who has indicated his/her understanding and acceptance.     Plan Discussed with: Anesthesiologist  Anesthesia Plan Comments:         Anesthesia Quick Evaluation

## 2011-11-29 NOTE — Progress Notes (Signed)
Nicole Moon is a 21 y.o. G1P0000 at [redacted]w[redacted]d   Subjective: Pt with complaint of intermittent lower back pain.  Relieved by Tylenol.  Denies headache or visual changes.  No SOB.  Objective: BP 126/83  Pulse 80  Temp 97.9 F (36.6 C) (Oral)  Resp 20  Ht 5\' 6"  (1.676 m)  Wt 105.235 kg (232 lb)  BMI 37.45 kg/m2  LMP 03/18/2011 I/O last 3 completed shifts: In: 7185.2 [P.O.:2770; I.V.:4415.2] Out: 4575 [Urine:4575]    FHT:  FHR: 120s bpm, variability: moderate,  accelerations:  Present,  decelerations:  Absent UC:   irregular, every 2-7 minutes SVE:   Dilation: 1 Effacement (%): 70 Station: -2 Exam by:: Dr. Dion Body AROM clear fluid  Labs: Lab Results  Component Value Date   WBC 10.0 11/29/2011   HGB 10.1* 11/29/2011   HCT 30.3* 11/29/2011   MCV 88.1 11/29/2011   PLT 100* 11/29/2011   LFTs trending down.  Assessment / Plan: IUP at 36 4/7 weeks.  HELLP syndrome, stable IOL with Cytotec and Pitocin to date.  S/p AROM to induce labor. Cont labs every 6 hours until delivery. Encouraged pt to get epidural catheter.  D/w Dr. Malen Gauze and states an epidural is a consideration at this time. Cont Magnesium sulfate for seizure prophylaxis. Allow pt to continue labor.  If not in active labor this evening, plan for c-section.   Tyrrell Stephens 11/29/2011, 8:25 AM

## 2011-11-30 LAB — COMPREHENSIVE METABOLIC PANEL
ALT: 40 U/L — ABNORMAL HIGH (ref 0–35)
ALT: 53 U/L — ABNORMAL HIGH (ref 0–35)
AST: 44 U/L — ABNORMAL HIGH (ref 0–37)
AST: 50 U/L — ABNORMAL HIGH (ref 0–37)
Albumin: 1.8 g/dL — ABNORMAL LOW (ref 3.5–5.2)
Albumin: 2.2 g/dL — ABNORMAL LOW (ref 3.5–5.2)
Alkaline Phosphatase: 213 U/L — ABNORMAL HIGH (ref 39–117)
Alkaline Phosphatase: 206 U/L — ABNORMAL HIGH (ref 39–117)
BUN: 10 mg/dL (ref 6–23)
BUN: 9 mg/dL (ref 6–23)
CO2: 18 mEq/L — ABNORMAL LOW (ref 19–32)
CO2: 21 mEq/L (ref 19–32)
CO2: 22 mEq/L (ref 19–32)
Calcium: 8.4 mg/dL (ref 8.4–10.5)
Chloride: 101 mEq/L (ref 96–112)
Chloride: 102 mEq/L (ref 96–112)
Chloride: 103 mEq/L (ref 96–112)
Chloride: 99 mEq/L (ref 96–112)
Creatinine, Ser: 0.98 mg/dL (ref 0.50–1.10)
Creatinine, Ser: 1.19 mg/dL — ABNORMAL HIGH (ref 0.50–1.10)
Creatinine, Ser: 1.2 mg/dL — ABNORMAL HIGH (ref 0.50–1.10)
GFR calc Af Amer: 75 mL/min — ABNORMAL LOW (ref >90)
GFR calc Af Amer: >90 mL/min (ref >90)
GFR calc non Af Amer: 65 mL/min — ABNORMAL LOW (ref >90)
GFR calc non Af Amer: 82 mL/min — ABNORMAL LOW (ref >90)
GFR calc non Af Amer: 82 mL/min — ABNORMAL LOW (ref >90)
Glucose, Bld: 114 mg/dL — ABNORMAL HIGH (ref 70–99)
Glucose, Bld: 103 mg/dL — ABNORMAL HIGH (ref 70–99)
Potassium: 3.7 mEq/L (ref 3.5–5.1)
Sodium: 130 mEq/L — ABNORMAL LOW (ref 135–145)
Sodium: 135 mEq/L (ref 135–145)
Total Bilirubin: 0.4 mg/dL (ref 0.3–1.2)
Total Bilirubin: 0.3 mg/dL (ref 0.3–1.2)
Total Bilirubin: 0.2 mg/dL — ABNORMAL LOW (ref 0.3–1.2)

## 2011-11-30 LAB — CBC
HCT: 29.8 % — ABNORMAL LOW (ref 36.0–46.0)
Hemoglobin: 9.8 g/dL — ABNORMAL LOW (ref 12.0–15.0)
MCH: 29 pg (ref 26.0–34.0)
MCHC: 33.7 g/dL (ref 30.0–36.0)
MCV: 86.9 fL (ref 78.0–100.0)
MCV: 87 fL (ref 78.0–100.0)
Platelets: 102 10*3/uL — ABNORMAL LOW (ref 150–400)
RBC: 3.55 MIL/uL — ABNORMAL LOW (ref 3.87–5.11)
RDW: 13.8 % (ref 11.5–15.5)
RDW: 13.8 % (ref 11.5–15.5)
RDW: 14.1 % (ref 11.5–15.5)
WBC: 18.6 10*3/uL — ABNORMAL HIGH (ref 4.0–10.5)
WBC: 17.2 10*3/uL — ABNORMAL HIGH (ref 4.0–10.5)
WBC: 13.6 10*3/uL — ABNORMAL HIGH (ref 4.0–10.5)

## 2011-11-30 LAB — DIC (DISSEMINATED INTRAVASCULAR COAGULATION)PANEL
D-Dimer, Quant: 7.14 ug/mL-FEU — ABNORMAL HIGH (ref 0.00–0.48)
Prothrombin Time: 13.3 seconds (ref 11.6–15.2)

## 2011-11-30 LAB — CBC WITH DIFFERENTIAL/PLATELET
Basophils Relative: 0 % (ref 0–1)
Eosinophils Absolute: 0 10*3/uL (ref 0.0–0.7)
Eosinophils Relative: 0 % (ref 0–5)
Lymphs Abs: 1.4 10*3/uL (ref 0.7–4.0)
MCH: 29.1 pg (ref 26.0–34.0)
MCHC: 33.1 g/dL (ref 30.0–36.0)
MCV: 87.9 fL (ref 78.0–100.0)
Platelets: 108 10*3/uL — ABNORMAL LOW (ref 150–400)
RBC: 3.54 MIL/uL — ABNORMAL LOW (ref 3.87–5.11)
RDW: 13.9 % (ref 11.5–15.5)

## 2011-11-30 LAB — URIC ACID
Uric Acid, Serum: 10.2 mg/dL — ABNORMAL HIGH (ref 2.4–7.0)
Uric Acid, Serum: 10.2 mg/dL — ABNORMAL HIGH (ref 2.4–7.0)

## 2011-11-30 LAB — LACTATE DEHYDROGENASE
LDH: 236 U/L (ref 94–250)
LDH: 251 U/L — ABNORMAL HIGH (ref 94–250)
LDH: 261 U/L — ABNORMAL HIGH (ref 94–250)
LDH: 263 U/L — ABNORMAL HIGH (ref 94–250)

## 2011-11-30 LAB — MAGNESIUM
Magnesium: 5.4 mg/dL — ABNORMAL HIGH (ref 1.5–2.5)
Magnesium: 5.9 mg/dL — ABNORMAL HIGH (ref 1.5–2.5)

## 2011-11-30 MED ORDER — ONDANSETRON HCL 4 MG PO TABS: 4.0000 mg | ORAL_TABLET | ORAL | Status: DC | PRN

## 2011-11-30 MED ORDER — MAGNESIUM HYDROXIDE 400 MG/5ML PO SUSP
30.0000 mL | ORAL | Status: DC | PRN
  Filled 2011-11-30: qty 30

## 2011-11-30 MED ORDER — OXYTOCIN 40 UNITS IN LACTATED RINGERS INFUSION - SIMPLE MED
62.5000 mL/h | INTRAVENOUS | Status: DC
  Administered 2011-11-30: 62.5 mL/h via INTRAVENOUS

## 2011-11-30 MED ORDER — DIBUCAINE 1 % RE OINT: 1.0000 "application " | TOPICAL_OINTMENT | RECTAL | Status: DC | PRN

## 2011-11-30 MED ORDER — FERROUS SULFATE 325 (65 FE) MG PO TABS
325.0000 mg | ORAL_TABLET | Freq: Two times a day (BID) | ORAL | Status: DC
  Administered 2011-11-30 – 2011-12-02 (×5): 325 mg via ORAL
  Filled 2011-11-30 (×5): qty 1

## 2011-11-30 MED ORDER — ONDANSETRON HCL 4 MG/2ML IJ SOLN
4.0000 mg | INTRAMUSCULAR | Status: DC | PRN
  Filled 2011-11-30: qty 2

## 2011-11-30 MED ORDER — ZOLPIDEM TARTRATE 5 MG PO TABS: 5.0000 mg | ORAL_TABLET | Freq: Every evening | ORAL | Status: DC | PRN

## 2011-11-30 MED ORDER — OXYCODONE-ACETAMINOPHEN 5-325 MG PO TABS: 1.0000 | ORAL_TABLET | ORAL | Status: DC | PRN

## 2011-11-30 MED ORDER — BENZOCAINE-MENTHOL 20-0.5 % EX AERO: 1.0000 "application " | INHALATION_SPRAY | CUTANEOUS | Status: DC | PRN

## 2011-11-30 MED ORDER — DIPHENHYDRAMINE HCL 25 MG PO CAPS: 25.0000 mg | ORAL_CAPSULE | Freq: Four times a day (QID) | ORAL | Status: DC | PRN

## 2011-11-30 MED ORDER — MAGNESIUM SULFATE 40 G IN LACTATED RINGERS - SIMPLE
2.5000 g/h | INTRAVENOUS | Status: DC
  Administered 2011-11-30: 2 g/h via INTRAVENOUS
  Administered 2011-11-30: 2.5 g/h via INTRAVENOUS
  Filled 2011-11-30 (×2): qty 500

## 2011-11-30 MED ORDER — TETANUS-DIPHTH-ACELL PERTUSSIS 5-2.5-18.5 LF-MCG/0.5 IM SUSP
0.5000 mL | Freq: Once | INTRAMUSCULAR | Status: DC
  Filled 2011-11-30: qty 0.5

## 2011-11-30 MED ORDER — SENNOSIDES-DOCUSATE SODIUM 8.6-50 MG PO TABS
2.0000 | ORAL_TABLET | Freq: Every day | ORAL | Status: DC
  Administered 2011-11-30 (×2): 2 via ORAL

## 2011-11-30 MED ORDER — LACTATED RINGERS IV SOLN
INTRAVENOUS | Status: DC
  Administered 2011-11-30: 16:00:00 via INTRAVENOUS

## 2011-11-30 MED ORDER — OXYTOCIN 40 UNITS IN LACTATED RINGERS INFUSION - SIMPLE MED: 62.5000 mL/h | INTRAVENOUS | Status: DC | PRN

## 2011-11-30 MED ORDER — WITCH HAZEL-GLYCERIN EX PADS: 1.0000 "application " | MEDICATED_PAD | CUTANEOUS | Status: DC | PRN

## 2011-11-30 MED ORDER — ALBUTEROL SULFATE HFA 108 (90 BASE) MCG/ACT IN AERS
2.0000 | INHALATION_SPRAY | RESPIRATORY_TRACT | Status: DC | PRN
  Filled 2011-11-30: qty 6.7

## 2011-11-30 MED ORDER — PRENATAL MULTIVITAMIN CH
1.0000 | ORAL_TABLET | Freq: Every day | ORAL | Status: DC
  Administered 2011-11-30 – 2011-12-02 (×3): 1 via ORAL
  Filled 2011-11-30 (×3): qty 1

## 2011-11-30 MED ORDER — SIMETHICONE 80 MG PO CHEW: 80.0000 mg | CHEWABLE_TABLET | ORAL | Status: DC | PRN

## 2011-11-30 MED ORDER — IBUPROFEN 600 MG PO TABS
600.0000 mg | ORAL_TABLET | Freq: Four times a day (QID) | ORAL | Status: DC
  Administered 2011-11-30 – 2011-12-02 (×3): 600 mg via ORAL
  Filled 2011-11-30 (×4): qty 1

## 2011-11-30 MED ORDER — MISOPROSTOL 200 MCG PO TABS: 800.0000 ug | ORAL_TABLET | ORAL | Status: DC | PRN

## 2011-11-30 MED ORDER — LANOLIN HYDROUS EX OINT: TOPICAL_OINTMENT | CUTANEOUS | Status: DC | PRN

## 2011-11-30 NOTE — Progress Notes (Signed)
New Admit:  Pt rec'd via stretcher from BS following SVD viable female infant 8/7 at 2235. G1P1, 36.[redacted]wks gestation.   HELLP syndrome, not on Magnesium Sulfate on admission,  decr U/O - Foley patent and to BSD.    IV LR with pitocin inf at 62.5, LR at 62.5 on adm.  Baby in CN.  BR/BO.  Transferred to bed from stretcher easily, SR up x 2.  Oriented to room. n SR up x 2.  SO at BS.  MRSA nasal swab obt., CHG bath completed.  VSS.  NKA.

## 2011-11-30 NOTE — Progress Notes (Signed)
UR chart review completed.  

## 2011-11-30 NOTE — Progress Notes (Signed)
Post Partum Day 1 s/p vaginal delivery pt with HELLP syndrome  Subjective: tolerating PO Denies headache , visual disturbances, ruq pain, no sob .Marland Kitchen Reports increase edema in her hands since delivery.. Decreased lochia   Objective: Blood pressure 111/79, pulse 74, temperature 98.2 F (36.8 C), temperature source Oral, resp. rate 20, height 5\' 6"  (1.676 m), weight 107.82 kg (237 lb 11.2 oz), last menstrual period 03/18/2011, SpO2 98.00%, unknown if currently breastfeeding.  Physical Exam:  General: alert and cooperative CV rrr  Lungs clear  Lochia: appropriate Uterine Fundus: firm Incision: na DVT Evaluation: 1 + edema 1 + reflexes    Basename 11/30/11 0505 11/30/11 0005  HGB 9.9* 10.3*  HCT 29.8* 31.1*    Assessment/Plan: POD #1 s/p vaginal delivery with HELLP syndrome Adequate UOP Continue Magnesium sulfate until 2300 Serial labs  Will monitor closely     LOS: 7 days   Nicole Moon J. 11/30/2011, 12:10 PM

## 2011-11-30 NOTE — Anesthesia Postprocedure Evaluation (Signed)
  Anesthesia Post-op Note  Patient: Nicole Moon  Procedure(s) Performed: * No procedures listed *  Patient Location: A-ICU  Anesthesia Type: Epidural  Level of Consciousness: awake  Airway and Oxygen Therapy: Patient Spontanous Breathing  Post-op Pain: mild  Post-op Assessment: Patient's Cardiovascular Status Stable and Respiratory Function Stable  Post-op Vital Signs: stable  Complications: No apparent anesthesia complications

## 2011-12-01 LAB — COMPREHENSIVE METABOLIC PANEL
ALT: 33 U/L (ref 0–35)
AST: 32 U/L (ref 0–37)
Albumin: 1.9 g/dL — ABNORMAL LOW (ref 3.5–5.2)
Albumin: 1.9 g/dL — ABNORMAL LOW (ref 3.5–5.2)
Alkaline Phosphatase: 190 U/L — ABNORMAL HIGH (ref 39–117)
BUN: 9 mg/dL (ref 6–23)
BUN: 9 mg/dL (ref 6–23)
CO2: 22 mEq/L (ref 19–32)
Calcium: 7.8 mg/dL — ABNORMAL LOW (ref 8.4–10.5)
Chloride: 106 mEq/L (ref 96–112)
Creatinine, Ser: 0.92 mg/dL (ref 0.50–1.10)
Creatinine, Ser: 0.94 mg/dL (ref 0.50–1.10)
GFR calc Af Amer: >90 mL/min (ref >90)
GFR calc non Af Amer: 89 mL/min — ABNORMAL LOW (ref >90)
Glucose, Bld: 100 mg/dL — ABNORMAL HIGH (ref 70–99)
Potassium: 3.7 mEq/L (ref 3.5–5.1)
Sodium: 138 mEq/L (ref 135–145)
Total Bilirubin: 0.1 mg/dL — ABNORMAL LOW (ref 0.3–1.2)
Total Bilirubin: 0.2 mg/dL — ABNORMAL LOW (ref 0.3–1.2)
Total Protein: 4.9 g/dL — ABNORMAL LOW (ref 6.0–8.3)
Total Protein: 5.1 g/dL — ABNORMAL LOW (ref 6.0–8.3)

## 2011-12-01 LAB — CBC
HCT: 28.6 % — ABNORMAL LOW (ref 36.0–46.0)
Hemoglobin: 9.5 g/dL — ABNORMAL LOW (ref 12.0–15.0)
MCH: 29.3 pg (ref 26.0–34.0)
MCHC: 33.2 g/dL (ref 30.0–36.0)
MCV: 87.8 fL (ref 78.0–100.0)
MCV: 88.3 fL (ref 78.0–100.0)
Platelets: 97 10*3/uL — ABNORMAL LOW (ref 150–400)
Platelets: 98 10*3/uL — ABNORMAL LOW (ref 150–400)
RBC: 3.24 MIL/uL — ABNORMAL LOW (ref 3.87–5.11)
RBC: 3.27 MIL/uL — ABNORMAL LOW (ref 3.87–5.11)
RDW: 14.4 % (ref 11.5–15.5)
WBC: 12.2 10*3/uL — ABNORMAL HIGH (ref 4.0–10.5)
WBC: 12.4 10*3/uL — ABNORMAL HIGH (ref 4.0–10.5)

## 2011-12-01 LAB — LACTATE DEHYDROGENASE
LDH: 175 U/L (ref 94–250)
LDH: 213 U/L (ref 94–250)

## 2011-12-01 LAB — MAGNESIUM: Magnesium: 5.1 mg/dL — ABNORMAL HIGH (ref 1.5–2.5)

## 2011-12-01 LAB — URIC ACID
Uric Acid, Serum: 9.9 mg/dL — ABNORMAL HIGH (ref 2.4–7.0)
Uric Acid, Serum: 9.9 mg/dL — ABNORMAL HIGH (ref 2.4–7.0)

## 2011-12-01 MED ORDER — RHO D IMMUNE GLOBULIN 1500 UNIT/2ML IJ SOLN
300.0000 ug | Freq: Once | INTRAMUSCULAR | Status: AC
  Administered 2011-12-01: 300 ug via INTRAMUSCULAR
  Filled 2011-12-01: qty 2

## 2011-12-01 NOTE — Progress Notes (Signed)
Patient ID: Nicole Moon, female   DOB: [birth year], 21 y.o.   MRN: [id number]  In to assess pt. No complaints.  Bleeding normal.  Denies headaches, visual changes, SOB. Gen:  NAD Abdomen:  Soft, non tender Neuro:  DTR not illicited  A/P:  S/p SVD, Induced at 36 weeks due to HELLP syndrome. Clinically stable. Repeat CBC in am to assess thrombocytopenia. Anticipate discharge in am.

## 2011-12-01 NOTE — Progress Notes (Signed)
Post Partum Day 2 Subjective: no complaints.  Lochia is not heavy.  Denies headaches, visual changes, SOB, RUQ pain.  Baby doing well.  Minimal ambulation.  Objective: Blood pressure 119/64, pulse 74, temperature 97.4 F (36.3 C), temperature source Oral, resp. rate 16, height 5\' 6"  (1.676 m), weight 104.282 kg (229 lb 14.4 oz), last menstrual period 03/18/2011, SpO2 99.00%, unknown if currently breastfeeding.  Physical Exam:  General: alert, cooperative and no distress Lochia: appropriate Uterine Fundus: firm Incision: N/a DVT Evaluation: No evidence of DVT seen on physical exam. Calf/Ankle edema is present. 1+   Basename 12/01/11 0600 11/30/11 2356  HGB 9.5* 9.5*  HCT 28.6* 28.7*  Labs stable.  Plts 98, 98, 97  Assessment/Plan: Plan for discharge tomorrow  Send to mother/baby Labs stable.  UOP improved.  BP normal. D/c IV, discontinue serial labs.  Check CBC in am.  LOS: 8 days   Djon Tith 12/01/2011, 7:54 AM

## 2011-12-01 NOTE — Progress Notes (Signed)
Pt. Is stable and ready to transfer to room 110. Report was called to [place], Charity fundraiser. All belongings are with patient.

## 2011-12-02 LAB — TYPE AND SCREEN
ABO/RH(D): O NEG
Antibody Screen: POSITIVE
DAT, IgG: NEGATIVE
Unit division: 0

## 2011-12-02 LAB — CBC
HCT: 25.8 % — ABNORMAL LOW (ref 36.0–46.0)
Hemoglobin: 8.6 g/dL — ABNORMAL LOW (ref 12.0–15.0)
MCHC: 33.3 g/dL (ref 30.0–36.0)

## 2011-12-02 LAB — RH IG WORKUP (INCLUDES ABO/RH)
Fetal Screen: NEGATIVE
Gestational Age(Wks): 36.2

## 2011-12-02 MED ORDER — IBUPROFEN 600 MG PO TABS: 600.0000 mg | ORAL_TABLET | Freq: Four times a day (QID) | ORAL | Status: AC

## 2011-12-02 NOTE — Discharge Summary (Signed)
Obstetric Discharge Summary Reason for Admission: HELLP Syndrome Prenatal Procedures: NST and ultrasound Intrapartum Procedures: Magnesium sulfate, Cytotec and Pitocin.   Postpartum Procedures: Magnesium, Rhogam Complications-Operative and Postpartum: vaginal laceration, repaired Hemoglobin  Date Value Range Status  12/02/2011 8.6* 12.0 - 15.0 g/dL Final     HCT  Date Value Range Status  12/02/2011 25.8* 36.0 - 46.0 % Final    Physical Exam:  WNL  Discharge Diagnoses: Term Pregnancy-delivered and HELLP Syndrome  Discharge Information: Date: 12/02/2011 Activity: pelvic rest Diet: routine Medications: PNV and Ibuprofen Condition: stable Instructions: See discharge instructions Discharge to: home Follow-up Information    Follow up with Geryl Rankins, MD. Schedule an appointment as soon as possible for a visit in 1 week. (F/u HELLP syndrome)    Contact information:   [address] [place], [place]. [address] [place] [address] [phone]          Newborn Data: Live born female  Birth Weight: 5 lb 9.2 oz (2530 g) APGAR: 8, 8  Home with mother.  Nicole Moon 12/02/2011, 11:59 AM

## 2011-12-02 NOTE — Progress Notes (Signed)
Post Partum Day 3 Subjective: no complaints  Pt reports bleeding is like a normal period.  No sxs of preeclampsia.  Objective: Blood pressure 117/77, pulse 82, temperature 98.3 F (36.8 C), temperature source Oral, resp. rate 18, height 5\' 6"  (1.676 m), weight 104.282 kg (229 lb 14.4 oz), last menstrual period 03/18/2011, SpO2 99.00%, unknown if currently breastfeeding.  Physical Exam:  General: alert, cooperative and no distress Lochia: appropriate Uterine Fundus: firm Incision: n/a DVT Evaluation: No evidence of DVT seen on physical exam.   Basename 12/02/11 0456 12/01/11 0600  HGB 8.6* 9.5*  HCT 25.8* 28.6*  Plts up to 109.  Assessment/Plan: Discharge home Hg decreased but VSS and bleeding decreased.  Plts stable. F/u in 1 week. Postpartum depression precautions given.  LOS: 9 days   Nicole Moon 12/02/2011, 11:59 AM

## 2012-04-24 NOTE — L&D Delivery Note (Signed)
Delivery Note  After a 2 contraction 2nd stage, at 2151  a viable female was delivered via  (Presentation: LOA).  APGAR:8/9  ; weight pending.  40 units of pitocin diluted in 1000cc LR was infused rapidly IV.  The placenta separated spontaneously and delivered via CCT and maternal pushing effort.  It was inspected and appears to be intact with a 3 VC.  There were the following complications:   Anesthesia: Epidural  Episiotomy: none Lacerations: 1st degree periurethral Suture Repair: vicryl 4-0 Est. Blood Loss (mL): 150  Mom to postpartum.  Baby to Couplet care / Skin to Skin.   Delivery and repair by Dr. Burnis Medin under my supervision

## 2012-09-17 ENCOUNTER — Encounter (HOSPITAL_COMMUNITY): Payer: Self-pay

## 2012-09-17 ENCOUNTER — Emergency Department (HOSPITAL_COMMUNITY)
Admission: EM | Admit: 2012-09-17 | Discharge: 2012-09-17 | Disposition: A | Discharge status: Home / Self Care | Payer: BC Managed Care – PPO | Attending: Emergency Medicine | Admitting: Emergency Medicine

## 2012-09-17 ENCOUNTER — Emergency Department (HOSPITAL_COMMUNITY): Payer: BC Managed Care – PPO

## 2012-09-17 DIAGNOSIS — Z3490 Encounter for supervision of normal pregnancy, unspecified, unspecified trimester: Secondary | ICD-10-CM

## 2012-09-17 DIAGNOSIS — Z79899 Other long term (current) drug therapy: Secondary | ICD-10-CM | POA: Insufficient documentation

## 2012-09-17 DIAGNOSIS — O200 Threatened abortion: Secondary | ICD-10-CM | POA: Insufficient documentation

## 2012-09-17 DIAGNOSIS — R21 Rash and other nonspecific skin eruption: Secondary | ICD-10-CM | POA: Insufficient documentation

## 2012-09-17 DIAGNOSIS — R109 Unspecified abdominal pain: Secondary | ICD-10-CM | POA: Insufficient documentation

## 2012-09-17 DIAGNOSIS — J45909 Unspecified asthma, uncomplicated: Secondary | ICD-10-CM | POA: Insufficient documentation

## 2012-09-17 DIAGNOSIS — R110 Nausea: Secondary | ICD-10-CM | POA: Insufficient documentation

## 2012-09-17 DIAGNOSIS — R300 Dysuria: Secondary | ICD-10-CM | POA: Insufficient documentation

## 2012-09-17 LAB — CBC WITH DIFFERENTIAL/PLATELET
Basophils Absolute: 0 10*3/uL (ref 0.0–0.1)
Basophils Relative: 0 % (ref 0–1)
HCT: 34.6 % — ABNORMAL LOW (ref 36.0–46.0)
Lymphocytes Relative: 29 % (ref 12–46)
MCHC: 32.9 g/dL (ref 30.0–36.0)
Monocytes Absolute: 0.6 10*3/uL (ref 0.1–1.0)
Neutro Abs: 6.1 10*3/uL (ref 1.7–7.7)
Neutrophils Relative %: 64 % (ref 43–77)
Platelets: 159 10*3/uL (ref 150–400)
RDW: 13.9 % (ref 11.5–15.5)
WBC: 9.5 10*3/uL (ref 4.0–10.5)

## 2012-09-17 LAB — HCG, QUANTITATIVE, PREGNANCY: hCG, Beta Chain, Quant, S: 43578 m[IU]/mL — ABNORMAL HIGH (ref <5)

## 2012-09-17 LAB — URINALYSIS, ROUTINE W REFLEX MICROSCOPIC
Bilirubin Urine: NEGATIVE
Nitrite: NEGATIVE
Specific Gravity, Urine: 1.01 (ref 1.005–1.030)
Urobilinogen, UA: 0.2 mg/dL (ref 0.0–1.0)
pH: 7 (ref 5.0–8.0)

## 2012-09-17 LAB — POCT PREGNANCY, URINE: Preg Test, Ur: POSITIVE — AB

## 2012-09-17 MED ORDER — ONDANSETRON 4 MG PO TBDP: 4.0000 mg | ORAL_TABLET | Freq: Three times a day (TID) | ORAL | Status: DC | PRN

## 2012-09-17 MED ORDER — PROMETHAZINE HCL 25 MG PO TABS: 25.0000 mg | ORAL_TABLET | Freq: Four times a day (QID) | ORAL | Status: DC | PRN

## 2012-09-17 NOTE — ED Notes (Signed)
Pt c/o bilateral lower abd pain described as sharp in nature that started last night, was seen at urgent care last week with positive pregnancy test.  had one previous pregnancy that was preterm and diagnosed with HELP syndrome.

## 2012-09-17 NOTE — ED Notes (Signed)
Dr. Zackowski at bedside  

## 2012-09-17 NOTE — ED Notes (Addendum)
Pt reports found out last week she was pregnant at urgent care.  C/O r flank and lower abd pain since yesterday.  Denies vaginal bleeding or discharge.   Also reports vomiting since yesterday.  Presently denies any abd pain but c/o severe r flank pain. LMP April 25th.

## 2012-09-17 NOTE — ED Provider Notes (Signed)
History     This chart was scribed for Nicole Jakes, MD, MD by Nicole Moon, ED Scribe. The patient was seen in room APA09/APA09 and the patient's care was started at 12:22 PM.   CSN: [id number]  Arrival date & time 09/17/12  1049      Chief Complaint  Patient presents with  . Abdominal Pain    Patient is a 22 y.o. female presenting with abdominal pain. The history is provided by the patient and medical records. No language interpreter was used.  Abdominal Pain This is a new problem. The problem occurs constantly. The problem has been gradually improving. Associated symptoms include abdominal pain. Pertinent negatives include no headaches and no shortness of breath. Nothing aggravates the symptoms. Nothing relieves the symptoms.   HPI Comments: Nicole Moon is a 22 y.o. female who is pregnant who presents to the Emergency Department complaining of constant, sharp, moderate lower abdominal pain onset 1 day ago. Pt reports she is G2 P1 A0. LMP was April 25. She mentions that she has dysuria. Pt denies fever, chills, nausea, vomiting, diarrhea, weakness, cough, SOB and any other pain.    Pt does not have a PCP  Past Medical History  Diagnosis Date  . Asthma   . Gestational diabetes     Past Surgical History  Procedure Laterality Date  . Ankle surgery    . Fracture surgery      Family History  Problem Relation Age of Onset  . Heart disease Mother   . Heart disease Father   . Alcohol abuse Paternal Grandfather     History  Substance Use Topics  . Smoking status: Never Smoker   . Smokeless tobacco: Not on file  . Alcohol Use: No    OB History   Grav Para Term Preterm Abortions TAB SAB Ect Mult Living   1 1 0 1 0 0 0 0 0 1       Review of Systems  Constitutional: Negative for fever and chills.  HENT: Negative for congestion, rhinorrhea and neck pain.   Eyes: Negative for visual disturbance.  Respiratory: Negative for cough and shortness of breath.    Cardiovascular: Negative for leg swelling.  Gastrointestinal: Positive for nausea and abdominal pain. Negative for vomiting and diarrhea.  Genitourinary: Positive for dysuria.  Musculoskeletal: Negative for back pain.  Skin: Positive for rash.  Neurological: Negative for headaches.  Hematological: Does not bruise/bleed easily.  Psychiatric/Behavioral: Negative for confusion.    Allergies  Review of patient's allergies indicates no known allergies.  Home Medications   Current Outpatient Rx  Name  Route  Sig  Dispense  Refill  . ibuprofen (ADVIL,MOTRIN) 200 MG tablet   Oral   Take 200 mg by mouth every 6 (six) hours as needed for pain.         . Prenatal Vit-Fe Fumarate-FA (PRENATAL MULTIVITAMIN) TABS   Oral   Take 1 tablet by mouth daily.         Marland Kitchen EXPIRED: albuterol (PROVENTIL HFA;VENTOLIN HFA) 108 (90 BASE) MCG/ACT inhaler   Inhalation   Inhale 2 puffs into the lungs every 4 (four) hours as needed for wheezing.   1 Inhaler   0   . ondansetron (ZOFRAN ODT) 4 MG disintegrating tablet   Oral   Take 1 tablet (4 mg total) by mouth every 8 (eight) hours as needed for nausea.   20 tablet   0   . promethazine (PHENERGAN) 25 MG tablet   Oral  Take 1 tablet (25 mg total) by mouth every 6 (six) hours as needed for nausea.   20 tablet   0     BP 137/66  Pulse 92  Temp(Src) 99.2 F (37.3 C) (Oral)  Resp 18  Ht 5\' 5"  (1.651 m)  Wt 215 lb (97.523 kg)  BMI 35.78 kg/m2  SpO2 100%  LMP 08/16/2012  Physical Exam  Nursing note and vitals reviewed. Constitutional: She is oriented to person, place, and time. She appears well-developed and well-nourished. No distress.  HENT:  Head: Normocephalic and atraumatic.  Eyes: Conjunctivae are normal. Pupils are equal, round, and reactive to light.  Cardiovascular: Normal rate, regular rhythm and normal heart sounds.   No murmur heard. Pulmonary/Chest: Effort normal and breath sounds normal. No respiratory distress. She has no  wheezes. She has no rales.  Abdominal: Soft. Bowel sounds are normal. There is no tenderness.  Musculoskeletal: Normal range of motion. She exhibits no edema.  Neurological: She is alert and oriented to person, place, and time. No cranial nerve deficit. Coordination normal.  Skin: Skin is warm and dry.  Psychiatric: She has a normal mood and affect. Her behavior is normal.    ED Course  Procedures (including critical care time) DIAGNOSTIC STUDIES: Oxygen Saturation is 100% on room air, normal by my interpretation.    COORDINATION OF CARE: 12:29 PM Discussed ED treatment with pt and pt agrees.     Labs Reviewed  HCG, QUANTITATIVE, PREGNANCY - Abnormal; Notable for the following:    hCG, Beta Chain, Quant, S 16109 (*)    All other components within normal limits  CBC WITH DIFFERENTIAL - Abnormal; Notable for the following:    Hemoglobin 11.4 (*)    HCT 34.6 (*)    All other components within normal limits  POCT PREGNANCY, URINE - Abnormal; Notable for the following:    Preg Test, Ur POSITIVE (*)    All other components within normal limits  URINALYSIS, ROUTINE W REFLEX MICROSCOPIC   US Ob Comp Less 14 Wks  09/17/2012   *RADIOLOGY REPORT*  Clinical Data: Abdominal pain in early pregnancy  OBSTETRIC <14 WK [place] AND TRANSVAGINAL OB US  Technique:  Both transabdominal and transvaginal ultrasound examinations were performed for complete evaluation of the gestation as well as the maternal uterus, adnexal regions, and pelvic cul-de-sac.  Transvaginal technique was performed to assess early pregnancy.  Comparison:  None.  Intrauterine gestational sac:  Visualized/normal in shape. Yolk sac: Not seen Embryo: Seen Cardiac Activity: Seen Heart Rate: 158 bpm  CRL: 74.6  mm  13 w  4 d            [place] EDC: 03/21/2013  Maternal uterus/adnexae: The left ovary has a normal transabdominal appearance measuring 2.8 x 1.8 x 1.8 cm.  The right ovary was not seen with confidence either transabdominally or  endovaginally.  No pelvic fluid is noted  IMPRESSION: Single living intrauterine pregnancy demonstrating an estimated gestational age by crown-rump length of 13 weeks 4 days.  This is 9 weeks ahead of the expected estimated gestational age by LMP and correlates with best dating by today's exam.  Normal left ovary and non-visualized right ovary   Original Report Authenticated By: Rhodia Albright, M.D.   US Ob Transvaginal  09/17/2012   *RADIOLOGY REPORT*  Clinical Data: Abdominal pain in early pregnancy  OBSTETRIC <14 WK [place] AND TRANSVAGINAL OB US  Technique:  Both transabdominal and transvaginal ultrasound examinations were performed for complete evaluation of the gestation  as well as the maternal uterus, adnexal regions, and pelvic cul-de-sac.  Transvaginal technique was performed to assess early pregnancy.  Comparison:  None.  Intrauterine gestational sac:  Visualized/normal in shape. Yolk sac: Not seen Embryo: Seen Cardiac Activity: Seen Heart Rate: 158 bpm  CRL: 74.6  mm  13 w  4 d            [place] EDC: 03/21/2013  Maternal uterus/adnexae: The left ovary has a normal transabdominal appearance measuring 2.8 x 1.8 x 1.8 cm.  The right ovary was not seen with confidence either transabdominally or endovaginally.  No pelvic fluid is noted  IMPRESSION: Single living intrauterine pregnancy demonstrating an estimated gestational age by crown-rump length of 13 weeks 4 days.  This is 9 weeks ahead of the expected estimated gestational age by LMP and correlates with best dating by today's exam.  Normal left ovary and non-visualized right ovary   Original Report Authenticated By: Rhodia Albright, M.D.   Results for orders placed during the hospital encounter of 09/17/12  URINALYSIS, ROUTINE W REFLEX MICROSCOPIC      Result Value Range   Color, Urine YELLOW  YELLOW   APPearance CLEAR  CLEAR   Specific Gravity, Urine 1.010  1.005 - 1.030   pH 7.0  5.0 - 8.0   Glucose, UA NEGATIVE  NEGATIVE mg/dL   Hgb urine dipstick  NEGATIVE  NEGATIVE   Bilirubin Urine NEGATIVE  NEGATIVE   Ketones, ur NEGATIVE  NEGATIVE mg/dL   Protein, ur NEGATIVE  NEGATIVE mg/dL   Urobilinogen, UA 0.2  0.0 - 1.0 mg/dL   Nitrite NEGATIVE  NEGATIVE   Leukocytes, UA NEGATIVE  NEGATIVE  HCG, QUANTITATIVE, PREGNANCY      Result Value Range   hCG, Beta Chain, Quant, S 43578 (*) <5 mIU/mL  CBC WITH DIFFERENTIAL      Result Value Range   WBC 9.5  4.0 - 10.5 K/uL   RBC 4.02  3.87 - 5.11 MIL/uL   Hemoglobin 11.4 (*) 12.0 - 15.0 g/dL   HCT 81.1 (*) 91.4 - 78.2 %   MCV 86.1  78.0 - 100.0 fL   MCH 28.4  26.0 - 34.0 pg   MCHC 32.9  30.0 - 36.0 g/dL   RDW 95.6  21.3 - 08.6 %   Platelets 159  150 - 400 K/uL   Neutrophils Relative % 64  43 - 77 %   Neutro Abs 6.1  1.7 - 7.7 K/uL   Lymphocytes Relative 29  12 - 46 %   Lymphs Abs 2.8  0.7 - 4.0 K/uL   Monocytes Relative 6  3 - 12 %   Monocytes Absolute 0.6  0.1 - 1.0 K/uL   Eosinophils Relative 1  0 - 5 %   Eosinophils Absolute 0.1  0.0 - 0.7 K/uL   Basophils Relative 0  0 - 1 %   Basophils Absolute 0.0  0.0 - 0.1 K/uL  POCT PREGNANCY, URINE      Result Value Range   Preg Test, Ur POSITIVE (*) NEGATIVE      1. Threatened abortion   2. Pregnancy       MDM  Patient further along than expected by dates. Ultrasound shows that she is about 13 weeks. Seems to be a single IUP pregnancy that is viable. No evidence of urinary tract infection. Patient without vaginal bleeding. No significant abdominal tenderness. Patient will be discharged home referral provided to OB/GYN. Patient will be given antinausea medicine and will continue her  prenatal vitamins.     I personally performed the services described in this documentation, which was scribed in my presence. The recorded information has been reviewed and is accurate.     Nicole Jakes, MD 09/17/12 [phone]

## 2012-09-17 NOTE — ED Notes (Signed)
Patient would like something to drink at this time. RN made aware. Family given a drink and graham crackers at this time.

## 2012-09-25 ENCOUNTER — Other Ambulatory Visit: Payer: Self-pay | Admitting: Obstetrics & Gynecology

## 2012-09-25 DIAGNOSIS — O3680X0 Pregnancy with inconclusive fetal viability, not applicable or unspecified: Secondary | ICD-10-CM

## 2012-09-27 ENCOUNTER — Other Ambulatory Visit: Payer: Self-pay | Admitting: Obstetrics & Gynecology

## 2012-09-27 ENCOUNTER — Ambulatory Visit (INDEPENDENT_AMBULATORY_CARE_PROVIDER_SITE_OTHER): Payer: BC Managed Care – PPO

## 2012-09-27 ENCOUNTER — Encounter: Payer: Self-pay | Admitting: Obstetrics & Gynecology

## 2012-09-27 DIAGNOSIS — O3680X0 Pregnancy with inconclusive fetal viability, not applicable or unspecified: Secondary | ICD-10-CM

## 2012-09-27 NOTE — Progress Notes (Signed)
U/s performed, single, living, iup, meas @ 15 w 2 d, cx long and closed, nml fluid, bilat ovs seen

## 2012-10-04 ENCOUNTER — Encounter: Payer: Self-pay | Admitting: Adult Health

## 2012-10-04 ENCOUNTER — Other Ambulatory Visit: Payer: Self-pay | Admitting: Adult Health

## 2012-10-04 ENCOUNTER — Ambulatory Visit (INDEPENDENT_AMBULATORY_CARE_PROVIDER_SITE_OTHER): Payer: BC Managed Care – PPO | Admitting: Adult Health

## 2012-10-04 VITALS — BP 110/60 | Wt 233.0 lb

## 2012-10-04 DIAGNOSIS — Z1389 Encounter for screening for other disorder: Secondary | ICD-10-CM

## 2012-10-04 DIAGNOSIS — Z331 Pregnant state, incidental: Secondary | ICD-10-CM

## 2012-10-04 DIAGNOSIS — R51 Headache: Secondary | ICD-10-CM

## 2012-10-04 DIAGNOSIS — O09299 Supervision of pregnancy with other poor reproductive or obstetric history, unspecified trimester: Secondary | ICD-10-CM | POA: Insufficient documentation

## 2012-10-04 DIAGNOSIS — O09292 Supervision of pregnancy with other poor reproductive or obstetric history, second trimester: Secondary | ICD-10-CM

## 2012-10-04 DIAGNOSIS — O9989 Other specified diseases and conditions complicating pregnancy, childbirth and the puerperium: Secondary | ICD-10-CM

## 2012-10-04 DIAGNOSIS — Z3480 Encounter for supervision of other normal pregnancy, unspecified trimester: Secondary | ICD-10-CM

## 2012-10-04 LAB — POCT URINALYSIS DIPSTICK
Blood, UA: 1
Glucose, UA: NEGATIVE
Nitrite, UA: NEGATIVE

## 2012-10-04 LAB — COMPREHENSIVE METABOLIC PANEL
BUN: 6 mg/dL (ref 6–23)
CO2: 23 mEq/L (ref 19–32)
Creat: 0.53 mg/dL (ref 0.50–1.10)
Glucose, Bld: 108 mg/dL — ABNORMAL HIGH (ref 70–99)
Total Bilirubin: 0.3 mg/dL (ref 0.3–1.2)

## 2012-10-04 LAB — CBC
MCV: 83.9 fL (ref 78.0–100.0)
Platelets: 175 10*3/uL (ref 150–400)
RBC: 4.15 MIL/uL (ref 3.87–5.11)
RDW: 14.4 % (ref 11.5–15.5)
WBC: 11.1 10*3/uL — ABNORMAL HIGH (ref 4.0–10.5)

## 2012-10-04 NOTE — Patient Instructions (Addendum)
Pregnancy - Second Trimester The second trimester of pregnancy (3 to 6 months) is a period of rapid growth for you and your baby. At the end of the sixth month, your baby is about 9 inches long and weighs 1 1/2 pounds. You will begin to feel the baby move between 18 and 20 weeks of the pregnancy. This is called quickening. Weight gain is faster. A clear fluid (colostrum) may leak out of your breasts. You may feel small contractions of the womb (uterus). This is known as false labor or Braxton-Hicks contractions. This is like a practice for labor when the baby is ready to be born. Usually, the problems with morning sickness have usually passed by the end of your first trimester. Some women develop small dark blotches (called cholasma, mask of pregnancy) on their face that usually goes away after the baby is born. Exposure to the sun makes the blotches worse. Acne may also develop in some pregnant women and pregnant women who have acne, may find that it goes away. PRENATAL EXAMS  Blood work may continue to be done during prenatal exams. These tests are done to check on your health and the probable health of your baby. Blood work is used to follow your blood levels (hemoglobin). Anemia (low hemoglobin) is common during pregnancy. Iron and vitamins are given to help prevent this. You will also be checked for diabetes between 24 and 28 weeks of the pregnancy. Some of the previous blood tests may be repeated.  The size of the uterus is measured during each visit. This is to make sure that the baby is continuing to grow properly according to the dates of the pregnancy.  Your blood pressure is checked every prenatal visit. This is to make sure you are not getting toxemia.  Your urine is checked to make sure you do not have an infection, diabetes or protein in the urine.  Your weight is checked often to make sure gains are happening at the suggested rate. This is to ensure that both you and your baby are growing  normally.  Sometimes, an ultrasound is performed to confirm the proper growth and development of the baby. This is a test which bounces harmless sound waves off the baby so your caregiver can more accurately determine due dates. Sometimes, a test is done on the amniotic fluid surrounding the baby. This test is called an amniocentesis. The amniotic fluid is obtained by sticking a needle into the belly (abdomen). This is done to check the chromosomes in instances where there is a concern about possible genetic problems with the baby. It is also sometimes done near the end of pregnancy if an early delivery is required. In this case, it is done to help make sure the baby's lungs are mature enough for the baby to live outside of the womb. CHANGES OCCURING IN THE SECOND TRIMESTER OF PREGNANCY Your body goes through many changes during pregnancy. They vary from person to person. Talk to your caregiver about changes you notice that you are concerned about.  During the second trimester, you will likely have an increase in your appetite. It is normal to have cravings for certain foods. This varies from person to person and pregnancy to pregnancy.  Your lower abdomen will begin to bulge.  You may have to urinate more often because the uterus and baby are pressing on your bladder. It is also common to get more bladder infections during pregnancy. You can help this by drinking lots of fluids  and emptying your bladder before and after intercourse.  You may begin to get stretch marks on your hips, abdomen, and breasts. These are normal changes in the body during pregnancy. There are no exercises or medicines to take that prevent this change.  You may begin to develop swollen and bulging veins (varicose veins) in your legs. Wearing support hose, elevating your feet for 15 minutes, 3 to 4 times a day and limiting salt in your diet helps lessen the problem.  Heartburn may develop as the uterus grows and pushes up  against the stomach. Antacids recommended by your caregiver helps with this problem. Also, eating smaller meals 4 to 5 times a day helps.  Constipation can be treated with a stool softener or adding bulk to your diet. Drinking lots of fluids, and eating vegetables, fruits, and whole grains are helpful.  Exercising is also helpful. If you have been very active up until your pregnancy, most of these activities can be continued during your pregnancy. If you have been less active, it is helpful to start an exercise program such as walking.  Hemorrhoids may develop at the end of the second trimester. Warm sitz baths and hemorrhoid cream recommended by your caregiver helps hemorrhoid problems.  Backaches may develop during this time of your pregnancy. Avoid heavy lifting, wear low heal shoes, and practice good posture to help with backache problems.  Some pregnant women develop tingling and numbness of their hand and fingers because of swelling and tightening of ligaments in the wrist (carpel tunnel syndrome). This goes away after the baby is born.  As your breasts enlarge, you may have to get a bigger bra. Get a comfortable, cotton, support bra. Do not get a nursing bra until the last month of the pregnancy if you will be nursing the baby.  You may get a dark line from your belly button to the pubic area called the linea nigra.  You may develop rosy cheeks because of increase blood flow to the face.  You may develop spider looking lines of the face, neck, arms, and chest. These go away after the baby is born. HOME CARE INSTRUCTIONS   It is extremely important to avoid all smoking, herbs, alcohol, and unprescribed drugs during your pregnancy. These chemicals affect the formation and growth of the baby. Avoid these chemicals throughout the pregnancy to ensure the delivery of a healthy infant.  Most of your home care instructions are the same as suggested for the first trimester of your pregnancy.  Keep your caregiver's appointments. Follow your caregiver's instructions regarding medicine use, exercise, and diet.  During pregnancy, you are providing food for you and your baby. Continue to eat regular, well-balanced meals. Choose foods such as meat, fish, milk and other low fat dairy products, vegetables, fruits, and whole-grain breads and cereals. Your caregiver will tell you of the ideal weight gain.  A physical sexual relationship may be continued up until near the end of pregnancy if there are no other problems. Problems could include early (premature) leaking of amniotic fluid from the membranes, vaginal bleeding, abdominal pain, or other medical or pregnancy problems.  Exercise regularly if there are no restrictions. Check with your caregiver if you are unsure of the safety of some of your exercises. The greatest weight gain will occur in the last 2 trimesters of pregnancy. Exercise will help you:  Control your weight.  Get you in shape for labor and delivery.  Lose weight after you have the baby.  Wear  a good support or jogging bra for breast tenderness during pregnancy. This may help if worn during sleep. Pads or tissues may be used in the bra if you are leaking colostrum.  Do not use hot tubs, steam rooms or saunas throughout the pregnancy.  Wear your seat belt at all times when driving. This protects you and your baby if you are in an accident.  Avoid raw meat, uncooked cheese, cat litter boxes, and soil used by cats. These carry germs that can cause birth defects in the baby.  The second trimester is also a good time to visit your dentist for your dental health if this has not been done yet. Getting your teeth cleaned is okay. Use a soft toothbrush. Brush gently during pregnancy.  It is easier to leak urine during pregnancy. Tightening up and strengthening the pelvic muscles will help with this problem. Practice stopping your urination while you are going to the bathroom.  These are the same muscles you need to strengthen. It is also the muscles you would use as if you were trying to stop from passing gas. You can practice tightening these muscles up 10 times a set and repeating this about 3 times per day. Once you know what muscles to tighten up, do not perform these exercises during urination. It is more likely to contribute to an infection by backing up the urine.  Ask for help if you have financial, counseling, or nutritional needs during pregnancy. Your caregiver will be able to offer counseling for these needs as well as refer you for other special needs.  Your skin may become oily. If so, wash your face with mild soap, use non-greasy moisturizer and oil or cream based makeup. MEDICINES AND DRUG USE IN PREGNANCY  Take prenatal vitamins as directed. The vitamin should contain 1 milligram of folic acid. Keep all vitamins out of reach of children. Only a couple vitamins or tablets containing iron may be fatal to a baby or young child when ingested.  Avoid use of all medicines, including herbs, over-the-counter medicines, not prescribed or suggested by your caregiver. Only take over-the-counter or prescription medicines for pain, discomfort, or fever as directed by your caregiver. Do not use aspirin.  Let your caregiver also know about herbs you may be using.  Alcohol is related to a number of birth defects. This includes fetal alcohol syndrome. All alcohol, in any form, should be avoided completely. Smoking will cause low birth rate and premature babies.  Street or illegal drugs are very harmful to the baby. They are absolutely forbidden. A baby born to an addicted mother will be addicted at birth. The baby will go through the same withdrawal an adult does. SEEK MEDICAL CARE IF:  You have any concerns or worries during your pregnancy. It is better to call with your questions if you feel they cannot wait, rather than worry about them. SEEK IMMEDIATE MEDICAL CARE  IF:   An unexplained oral temperature above 102 F (38.9 C) develops, or as your caregiver suggests.  You have leaking of fluid from the vagina (birth canal). If leaking membranes are suspected, take your temperature and tell your caregiver of this when you call.  There is vaginal spotting, bleeding, or passing clots. Tell your caregiver of the amount and how many pads are used. Light spotting in pregnancy is common, especially following intercourse.  You develop a bad smelling vaginal discharge with a change in the color from clear to white.  You continue to feel  sick to your stomach (nauseated) and have no relief from remedies suggested. You vomit blood or coffee ground-like materials.  You lose more than 2 pounds of weight or gain more than 2 pounds of weight over 1 week, or as suggested by your caregiver.  You notice swelling of your face, hands, feet, or legs.  You get exposed to [place] measles and have never had them.  You are exposed to fifth disease or chickenpox.  You develop belly (abdominal) pain. Round ligament discomfort is a common non-cancerous (benign) cause of abdominal pain in pregnancy. Your caregiver still must evaluate you.  You develop a bad headache that does not go away.  You develop fever, diarrhea, pain with urination, or shortness of breath.  You develop visual problems, blurry, or double vision.  You fall or are in a car accident or any kind of trauma.  There is mental or physical violence at home. Document Released: 04/04/2001 Document Revised: 01/03/2012 Document Reviewed: 10/07/2008 [facility] Patient Information 2014 [place], [place]. Take tylenol and increase fluids

## 2012-10-04 NOTE — Progress Notes (Signed)
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 No bleeding or pain. Try tylenol and increase fluids to stay hydrated. Keep [place] and new OB appt as scheduled. Call prn problems

## 2012-10-04 NOTE — Progress Notes (Signed)
Headaches, low B/P, cramping, nauseated with no appetite.

## 2012-10-05 LAB — HEPATITIS B SURFACE ANTIGEN: Hepatitis B Surface Ag: NEGATIVE

## 2012-10-05 LAB — URINALYSIS, MICROSCOPIC ONLY
Casts: NONE SEEN
Crystals: NONE SEEN

## 2012-10-05 LAB — DRUG SCREEN, URINE, NO CONFIRMATION
Amphetamine Screen, Ur: NEGATIVE
Barbiturate Quant, Ur: NEGATIVE
Benzodiazepines.: NEGATIVE
Cocaine Metabolites: NEGATIVE
Marijuana Metabolite: NEGATIVE

## 2012-10-05 LAB — SICKLE CELL SCREEN: Sickle Cell Screen: NEGATIVE

## 2012-10-05 LAB — URINALYSIS, ROUTINE W REFLEX MICROSCOPIC
Bilirubin Urine: NEGATIVE
Glucose, UA: NEGATIVE mg/dL
pH: 6.5 (ref 5.0–8.0)

## 2012-10-05 LAB — ANTIBODY SCREEN: Antibody Screen: NEGATIVE

## 2012-10-05 LAB — VARICELLA ZOSTER ANTIBODY, IGG: Varicella IgG: 15.65 Index (ref <135.00)

## 2012-10-05 LAB — HIV ANTIBODY (ROUTINE TESTING W REFLEX): HIV: NONREACTIVE

## 2012-10-05 LAB — RPR

## 2012-10-06 LAB — URINE CULTURE

## 2012-10-07 ENCOUNTER — Telehealth: Payer: Self-pay | Admitting: Adult Health

## 2012-10-07 LAB — AFP, QUAD SCREEN
AFP: 31.5 IU/mL
Age Alone: 1:1160 {titer}
Curr Gest Age: 16.2 wks.days
MoM for AFP: 1.36
MoM for hCG: 1.33
Tri 18 Scr Risk Est: NEGATIVE
Trisomy 18 (Edward) Syndrome Interp.: 1:45800 {titer}

## 2012-10-07 NOTE — Telephone Encounter (Signed)
Pt aware AFP was negative

## 2012-10-11 ENCOUNTER — Emergency Department (HOSPITAL_COMMUNITY)
Admission: EM | Admit: 2012-10-11 | Discharge: 2012-10-12 | Disposition: A | Discharge status: Home / Self Care | Payer: Medicaid Other | Attending: Emergency Medicine | Admitting: Emergency Medicine

## 2012-10-11 ENCOUNTER — Encounter (HOSPITAL_COMMUNITY): Payer: Self-pay | Admitting: Emergency Medicine

## 2012-10-11 DIAGNOSIS — R350 Frequency of micturition: Secondary | ICD-10-CM | POA: Insufficient documentation

## 2012-10-11 DIAGNOSIS — O2390 Unspecified genitourinary tract infection in pregnancy, unspecified trimester: Secondary | ICD-10-CM | POA: Insufficient documentation

## 2012-10-11 DIAGNOSIS — O09299 Supervision of pregnancy with other poor reproductive or obstetric history, unspecified trimester: Secondary | ICD-10-CM | POA: Insufficient documentation

## 2012-10-11 DIAGNOSIS — N949 Unspecified condition associated with female genital organs and menstrual cycle: Secondary | ICD-10-CM | POA: Insufficient documentation

## 2012-10-11 DIAGNOSIS — R109 Unspecified abdominal pain: Secondary | ICD-10-CM | POA: Insufficient documentation

## 2012-10-11 DIAGNOSIS — N390 Urinary tract infection, site not specified: Secondary | ICD-10-CM | POA: Insufficient documentation

## 2012-10-11 DIAGNOSIS — O99810 Abnormal glucose complicating pregnancy: Secondary | ICD-10-CM | POA: Insufficient documentation

## 2012-10-11 DIAGNOSIS — J45909 Unspecified asthma, uncomplicated: Secondary | ICD-10-CM | POA: Insufficient documentation

## 2012-10-11 LAB — URINALYSIS, ROUTINE W REFLEX MICROSCOPIC
Glucose, UA: NEGATIVE mg/dL
Leukocytes, UA: NEGATIVE
Protein, ur: NEGATIVE mg/dL
Urobilinogen, UA: 0.2 mg/dL (ref 0.0–1.0)

## 2012-10-11 LAB — URINE MICROSCOPIC-ADD ON

## 2012-10-11 MED ORDER — ACETAMINOPHEN 325 MG PO TABS
650.0000 mg | ORAL_TABLET | Freq: Once | ORAL | Status: AC
  Administered 2012-10-11: 650 mg via ORAL
  Filled 2012-10-11: qty 2

## 2012-10-11 NOTE — ED Provider Notes (Signed)
History    This chart was scribed for Flint Melter, MD by Quintella Reichert, ED scribe.  This patient was seen in room APA08/APA08 and the patient's care was started at 11:34 PM.   CSN: [id number]  Arrival date & time 10/11/12  2259      Chief Complaint  Patient presents with  . Abdominal Pain  . Pelvic Pain     The history is provided by the patient. No language interpreter was used.    HPI Comments: Nicole Moon is a 17-weeks pregnant 22 y.o. female with h/o HELLP syndrome and gestational diabetes who presents to the Emergency Department complaining of constant, moderate, sharp lower abdominal pain.  Pt reports that 2 days ago she began to experience mild pain intermittently but today it has become constant and more severe.  She denies recent injuries that may have caused or exacerbated pain.  She attempted to treat pain with Tylenol, without relief.  She also reports urinary frequency.  She denies dysuria, vaginal discharge or bleeding, back pain, upper abdominal pain, fever, nausea, emesis, or any other associated symptoms.  On admission pt's BP is 111/53.  She denies sexual intercourse in the past 2 weeks.    Past Medical History  Diagnosis Date  . Asthma   . Gestational diabetes   . History of HELLP syndrome, currently pregnant     Past Surgical History  Procedure Laterality Date  . Ankle surgery    . Fracture surgery      Family History  Problem Relation Age of Onset  . Heart disease Mother   . Heart disease Father   . Alcohol abuse Paternal Grandfather     History  Substance Use Topics  . Smoking status: Never Smoker   . Smokeless tobacco: Not on file  . Alcohol Use: No    OB History   Grav Para Term Preterm Abortions TAB SAB Ect Mult Living   2 1 0 1 0 0 0 0 0 1       Review of Systems A complete 10 system review of systems was obtained and all systems are negative except as noted in the HPI and PMH.    Allergies  Review of patient's  allergies indicates no known allergies.  Home Medications   Current Outpatient Rx  Name  Route  Sig  Dispense  Refill  . EXPIRED: albuterol (PROVENTIL HFA;VENTOLIN HFA) 108 (90 BASE) MCG/ACT inhaler   Inhalation   Inhale 2 puffs into the lungs every 4 (four) hours as needed for wheezing.   1 Inhaler   0   . cephALEXin (KEFLEX) 500 MG capsule   Oral   Take 1 capsule (500 mg total) by mouth 4 (four) times daily.   28 capsule   0     BP 111/53  Pulse 86  Temp(Src) 98.4 F (36.9 C) (Oral)  Resp 20  Ht 5\' 6"  (1.676 m)  Wt 235 lb (106.595 kg)  BMI 37.95 kg/m2  SpO2 100%  LMP 08/16/2012  Physical Exam  Nursing note and vitals reviewed. Constitutional: She is oriented to person, place, and time. She appears well-developed and well-nourished.  HENT:  Head: Normocephalic and atraumatic.  Eyes: Conjunctivae and EOM are normal. Pupils are equal, round, and reactive to light.  Neck: Normal range of motion and phonation normal. Neck supple.  Cardiovascular: Normal rate, regular rhythm and intact distal pulses.   Pulmonary/Chest: Effort normal and breath sounds normal. She exhibits no tenderness.  Abdominal: Soft. She exhibits no  distension. There is tenderness. There is no guarding.  Bilateral and suprapubic tenderness, mild  Genitourinary:  No CVA tenderness  Musculoskeletal: Normal range of motion.  Neurological: She is alert and oriented to person, place, and time. She has normal strength. She exhibits normal muscle tone.  Skin: Skin is warm and dry.  Psychiatric: She has a normal mood and affect. Her behavior is normal. Judgment and thought content normal.    ED Course  Procedures (including critical care time)  Medications  acetaminophen (TYLENOL) tablet 650 mg (650 mg Oral Given 10/11/12 2348)    Patient Vitals for the past 24 hrs:  BP Temp Temp src Pulse Resp SpO2 Height Weight  10/11/12 2314 111/53 mmHg 98.4 F (36.9 C) Oral 86 20 100 % 5\' 6"  (1.676 m) 235 lb  (106.595 kg)     DIAGNOSTIC STUDIES: Oxygen Saturation is 100% on room air, normal by my interpretation.    COORDINATION OF CARE: 11:39 PM-Discussed treatment plan which includes 650 mg Tylenol, UA, urine culture and fetal heart tone monitoring with pt at bedside and pt agreed to plan.   Fetal heart tones 148, normal.  Reevaluation discharge: No further complaints. She feels somewhat better, after Tylenol.  Labs Reviewed  URINALYSIS, ROUTINE W REFLEX MICROSCOPIC - Abnormal; Notable for the following:    Hgb urine dipstick SMALL (*)    All other components within normal limits  URINE MICROSCOPIC-ADD ON - Abnormal; Notable for the following:    Squamous Epithelial / LPF FEW (*)    Bacteria, UA FEW (*)    All other components within normal limits  URINE CULTURE      1. UTI (lower urinary tract infection)       MDM abdominal pain, with abnormal urinalysis, consistent with urinary tract, infection. Fetal heart tones are normal for age, of gestation. Doubt pelvic infection, pregnancy complication or systemic infection.   Nursing Notes Reviewed/ Care Coordinated, and agree without changes. Applicable Imaging Reviewed.  Interpretation of Laboratory Data incorporated into ED treatment   Plan: Home Medications- Keflex, Tylenol; Treatments and Observation- Rest, fluid; return here for recheck if the recommended treatment, does not improve the symptoms; Recommended follow up- OB as needed for problems, and as scheduled for routine pregnancy care.       I personally performed the services described in this documentation, which was scribed in my presence. The recorded information has been reviewed and is accurate.    Flint Melter, MD 10/12/12 [phone]

## 2012-10-11 NOTE — ED Notes (Signed)
Dr. Effie Shy in room assessing patient at this time.

## 2012-10-11 NOTE — ED Notes (Addendum)
Patient complaining of sharp pelvic pain. Reports pain started two days ago and was intermittent. States pain has worsened tonight and is not going away. Patient is [redacted] weeks pregnant.

## 2012-10-12 MED ORDER — CEPHALEXIN 500 MG PO CAPS: 500.0000 mg | ORAL_CAPSULE | Freq: Four times a day (QID) | ORAL | Status: DC

## 2012-10-13 LAB — URINE CULTURE: Colony Count: 60000

## 2012-10-23 ENCOUNTER — Other Ambulatory Visit: Payer: Self-pay | Admitting: Obstetrics & Gynecology

## 2012-10-23 DIAGNOSIS — Z1389 Encounter for screening for other disorder: Secondary | ICD-10-CM

## 2012-10-28 ENCOUNTER — Ambulatory Visit (INDEPENDENT_AMBULATORY_CARE_PROVIDER_SITE_OTHER): Payer: BC Managed Care – PPO

## 2012-10-28 ENCOUNTER — Encounter: Payer: Self-pay | Admitting: Women's Health

## 2012-10-28 ENCOUNTER — Ambulatory Visit (INDEPENDENT_AMBULATORY_CARE_PROVIDER_SITE_OTHER): Payer: BC Managed Care – PPO | Admitting: Women's Health

## 2012-10-28 VITALS — BP 110/60 | Wt 233.0 lb

## 2012-10-28 DIAGNOSIS — O360990 Maternal care for other rhesus isoimmunization, unspecified trimester, not applicable or unspecified: Secondary | ICD-10-CM

## 2012-10-28 DIAGNOSIS — O09899 Supervision of other high risk pregnancies, unspecified trimester: Secondary | ICD-10-CM | POA: Insufficient documentation

## 2012-10-28 DIAGNOSIS — Z6791 Unspecified blood type, Rh negative: Secondary | ICD-10-CM | POA: Insufficient documentation

## 2012-10-28 DIAGNOSIS — O360120 Maternal care for anti-D [Rh] antibodies, second trimester, not applicable or unspecified: Secondary | ICD-10-CM

## 2012-10-28 DIAGNOSIS — O09299 Supervision of pregnancy with other poor reproductive or obstetric history, unspecified trimester: Secondary | ICD-10-CM

## 2012-10-28 DIAGNOSIS — Z331 Pregnant state, incidental: Secondary | ICD-10-CM

## 2012-10-28 DIAGNOSIS — O09219 Supervision of pregnancy with history of pre-term labor, unspecified trimester: Secondary | ICD-10-CM

## 2012-10-28 DIAGNOSIS — Z1389 Encounter for screening for other disorder: Secondary | ICD-10-CM

## 2012-10-28 DIAGNOSIS — O09292 Supervision of pregnancy with other poor reproductive or obstetric history, second trimester: Secondary | ICD-10-CM

## 2012-10-28 DIAGNOSIS — O09892 Supervision of other high risk pregnancies, second trimester: Secondary | ICD-10-CM

## 2012-10-28 DIAGNOSIS — O26899 Other specified pregnancy related conditions, unspecified trimester: Secondary | ICD-10-CM | POA: Insufficient documentation

## 2012-10-28 DIAGNOSIS — O0990 Supervision of high risk pregnancy, unspecified, unspecified trimester: Secondary | ICD-10-CM | POA: Insufficient documentation

## 2012-10-28 DIAGNOSIS — Z8632 Personal history of gestational diabetes: Secondary | ICD-10-CM

## 2012-10-28 DIAGNOSIS — O0992 Supervision of high risk pregnancy, unspecified, second trimester: Secondary | ICD-10-CM

## 2012-10-28 LAB — POCT URINALYSIS DIPSTICK
Leukocytes, UA: NEGATIVE
Nitrite, UA: NEGATIVE
Protein, UA: NEGATIVE

## 2012-10-28 NOTE — Progress Notes (Signed)
Ultrasound today, no complaints at this time.

## 2012-10-28 NOTE — Progress Notes (Signed)
  Subjective:    SABELLA TRAORE is a 22 y.o. G97P0101 Caucasian female at [redacted]w[redacted]d by 13.4wk u/s, being seen today for her first obstetrical visit.  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  Pregnancy history fully reviewed. Had normal pap at end of 2013 per pt.   Patient denies n/v, uti s/s, cramping, lof, or vb. Reports good fm.   Filed Vitals:   10/28/12 1115  BP: 110/60  Weight: 233 lb (105.688 kg)    HISTORY: OB History   Grav Para Term Preterm Abortions TAB SAB Ect Mult Living   2 1 0 1 0 0 0 0 0 1      # Outc Date GA Lbr Len/2nd Wgt Sex Del Anes PTL Lv   1 PRE 8/13 [redacted]w[redacted]d 13:55 / 00:57  F SVD EPI  Yes   Comments: caput   2 CUR              Past Medical History  Diagnosis Date  . Asthma   . Gestational diabetes   . History of HELLP syndrome, currently pregnant    Past Surgical History  Procedure Laterality Date  . Ankle surgery    . Fracture surgery     Family History  Problem Relation Age of Onset  . Heart disease Mother   . Heart disease Father   . Alcohol abuse Paternal Grandfather      Exam   System:     Skin: normal coloration and turgor, no rashes    Neurologic: oriented, normal mood   Extremities: normal strength, tone, and muscle mass   HEENT PERRLA   Mouth/Teeth mucous membranes moist   Cardiovascular: regular rate and rhythm   Respiratory:  appears well, vitals normal, no respiratory distress, acyanotic, normal RR   Abdomen: soft, non-tender    Thin prep pap smear not obtained- pt states she had normal pap at end of last year.    Assessment:    Pregnancy: G2P0101 Patient Active Problem List   Diagnosis Date Noted  . Supervision of high-risk pregnancy 10/28/2012    Priority: High  . H/O preterm delivery, currently pregnant 10/28/2012    Priority: High  . History of HELLP syndrome, currently  pregnant 10/04/2012    Priority: High  . 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

## 2012-10-28 NOTE — Progress Notes (Signed)
U/S(19+5wks)-active fetus, meas c/w dates, fluid WNL, ant gr 0 plac, cx long and closed, no obvious abnl noted, female fetus

## 2012-10-28 NOTE — Patient Instructions (Addendum)
Pregnancy - Second Trimester The second trimester of pregnancy (3 to 6 months) is a period of rapid growth for you and your baby. At the end of the sixth month, your baby is about 9 inches long and weighs 1 1/2 pounds. You will begin to feel the baby move between 18 and 20 weeks of the pregnancy. This is called quickening. Weight gain is faster. A clear fluid (colostrum) may leak out of your breasts. You may feel small contractions of the womb (uterus). This is known as false labor or Braxton-Hicks contractions. This is like a practice for labor when the baby is ready to be born. Usually, the problems with morning sickness have usually passed by the end of your first trimester. Some women develop small dark blotches (called cholasma, mask of pregnancy) on their face that usually goes away after the baby is born. Exposure to the sun makes the blotches worse. Acne may also develop in some pregnant women and pregnant women who have acne, may find that it goes away. PRENATAL EXAMS  Blood work may continue to be done during prenatal exams. These tests are done to check on your health and the probable health of your baby. Blood work is used to follow your blood levels (hemoglobin). Anemia (low hemoglobin) is common during pregnancy. Iron and vitamins are given to help prevent this. You will also be checked for diabetes between 24 and 28 weeks of the pregnancy. Some of the previous blood tests may be repeated.  The size of the uterus is measured during each visit. This is to make sure that the baby is continuing to grow properly according to the dates of the pregnancy.  Your blood pressure is checked every prenatal visit. This is to make sure you are not getting toxemia.  Your urine is checked to make sure you do not have an infection, diabetes or protein in the urine.  Your weight is checked often to make sure gains are happening at the suggested rate. This is to ensure that both you and your baby are  growing normally.  Sometimes, an ultrasound is performed to confirm the proper growth and development of the baby. This is a test which bounces harmless sound waves off the baby so your caregiver can more accurately determine due dates. Sometimes, a test is done on the amniotic fluid surrounding the baby. This test is called an amniocentesis. The amniotic fluid is obtained by sticking a needle into the belly (abdomen). This is done to check the chromosomes in instances where there is a concern about possible genetic problems with the baby. It is also sometimes done near the end of pregnancy if an early delivery is required. In this case, it is done to help make sure the baby's lungs are mature enough for the baby to live outside of the womb. CHANGES OCCURING IN THE SECOND TRIMESTER OF PREGNANCY Your body goes through many changes during pregnancy. They vary from person to person. Talk to your caregiver about changes you notice that you are concerned about.  During the second trimester, you will likely have an increase in your appetite. It is normal to have cravings for certain foods. This varies from person to person and pregnancy to pregnancy.  Your lower abdomen will begin to bulge.  You may have to urinate more often because the uterus and baby are pressing on your bladder. It is also common to get more bladder infections during pregnancy. You can help this by drinking lots of fluids   and emptying your bladder before and after intercourse.  You may begin to get stretch marks on your hips, abdomen, and breasts. These are normal changes in the body during pregnancy. There are no exercises or medicines to take that prevent this change.  You may begin to develop swollen and bulging veins (varicose veins) in your legs. Wearing support hose, elevating your feet for 15 minutes, 3 to 4 times a day and limiting salt in your diet helps lessen the problem.  Heartburn may develop as the uterus grows and  pushes up against the stomach. Antacids recommended by your caregiver helps with this problem. Also, eating smaller meals 4 to 5 times a day helps.  Constipation can be treated with a stool softener or adding bulk to your diet. Drinking lots of fluids, and eating vegetables, fruits, and whole grains are helpful.  Exercising is also helpful. If you have been very active up until your pregnancy, most of these activities can be continued during your pregnancy. If you have been less active, it is helpful to start an exercise program such as walking.  Hemorrhoids may develop at the end of the second trimester. Warm sitz baths and hemorrhoid cream recommended by your caregiver helps hemorrhoid problems.  Backaches may develop during this time of your pregnancy. Avoid heavy lifting, wear low heal shoes, and practice good posture to help with backache problems.  Some pregnant women develop tingling and numbness of their hand and fingers because of swelling and tightening of ligaments in the wrist (carpel tunnel syndrome). This goes away after the baby is born.  As your breasts enlarge, you may have to get a bigger bra. Get a comfortable, cotton, support bra. Do not get a nursing bra until the last month of the pregnancy if you will be nursing the baby.  You may get a dark line from your belly button to the pubic area called the linea nigra.  You may develop rosy cheeks because of increase blood flow to the face.  You may develop spider looking lines of the face, neck, arms, and chest. These go away after the baby is born. HOME CARE INSTRUCTIONS   It is extremely important to avoid all smoking, herbs, alcohol, and unprescribed drugs during your pregnancy. These chemicals affect the formation and growth of the baby. Avoid these chemicals throughout the pregnancy to ensure the delivery of a healthy infant.  Most of your home care instructions are the same as suggested for the first trimester of your  pregnancy. Keep your caregiver's appointments. Follow your caregiver's instructions regarding medicine use, exercise, and diet.  During pregnancy, you are providing food for you and your baby. Continue to eat regular, well-balanced meals. Choose foods such as meat, fish, milk and other low fat dairy products, vegetables, fruits, and whole-grain breads and cereals. Your caregiver will tell you of the ideal weight gain.  A physical sexual relationship may be continued up until near the end of pregnancy if there are no other problems. Problems could include early (premature) leaking of amniotic fluid from the membranes, vaginal bleeding, abdominal pain, or other medical or pregnancy problems.  Exercise regularly if there are no restrictions. Check with your caregiver if you are unsure of the safety of some of your exercises. The greatest weight gain will occur in the last 2 trimesters of pregnancy. Exercise will help you:  Control your weight.  Get you in shape for labor and delivery.  Lose weight after you have the baby.  Wear   a good support or jogging bra for breast tenderness during pregnancy. This may help if worn during sleep. Pads or tissues may be used in the bra if you are leaking colostrum.  Do not use hot tubs, steam rooms or saunas throughout the pregnancy.  Wear your seat belt at all times when driving. This protects you and your baby if you are in an accident.  Avoid raw meat, uncooked cheese, cat litter boxes, and soil used by cats. These carry germs that can cause birth defects in the baby.  The second trimester is also a good time to visit your dentist for your dental health if this has not been done yet. Getting your teeth cleaned is okay. Use a soft toothbrush. Brush gently during pregnancy.  It is easier to leak urine during pregnancy. Tightening up and strengthening the pelvic muscles will help with this problem. Practice stopping your urination while you are going to the  bathroom. These are the same muscles you need to strengthen. It is also the muscles you would use as if you were trying to stop from passing gas. You can practice tightening these muscles up 10 times a set and repeating this about 3 times per day. Once you know what muscles to tighten up, do not perform these exercises during urination. It is more likely to contribute to an infection by backing up the urine.  Ask for help if you have financial, counseling, or nutritional needs during pregnancy. Your caregiver will be able to offer counseling for these needs as well as refer you for other special needs.  Your skin may become oily. If so, wash your face with mild soap, use non-greasy moisturizer and oil or cream based makeup. MEDICINES AND DRUG USE IN PREGNANCY  Take prenatal vitamins as directed. The vitamin should contain 1 milligram of folic acid. Keep all vitamins out of reach of children. Only a couple vitamins or tablets containing iron may be fatal to a baby or young child when ingested.  Avoid use of all medicines, including herbs, over-the-counter medicines, not prescribed or suggested by your caregiver. Only take over-the-counter or prescription medicines for pain, discomfort, or fever as directed by your caregiver. Do not use aspirin.  Let your caregiver also know about herbs you may be using.  Alcohol is related to a number of birth defects. This includes fetal alcohol syndrome. All alcohol, in any form, should be avoided completely. Smoking will cause low birth rate and premature babies.  Street or illegal drugs are very harmful to the baby. They are absolutely forbidden. A baby born to an addicted mother will be addicted at birth. The baby will go through the same withdrawal an adult does. SEEK MEDICAL CARE IF:  You have any concerns or worries during your pregnancy. It is better to call with your questions if you feel they cannot wait, rather than worry about them. SEEK IMMEDIATE  MEDICAL CARE IF:   An unexplained oral temperature above 102 F (38.9 C) develops, or as your caregiver suggests.  You have leaking of fluid from the vagina (birth canal). If leaking membranes are suspected, take your temperature and tell your caregiver of this when you call.  There is vaginal spotting, bleeding, or passing clots. Tell your caregiver of the amount and how many pads are used. Light spotting in pregnancy is common, especially following intercourse.  You develop a bad smelling vaginal discharge with a change in the color from clear to white.  You continue to feel   sick to your stomach (nauseated) and have no relief from remedies suggested. You vomit blood or coffee ground-like materials.  You lose more than 2 pounds of weight or gain more than 2 pounds of weight over 1 week, or as suggested by your caregiver.  You notice swelling of your face, hands, feet, or legs.  You get exposed to German measles and have never had them.  You are exposed to fifth disease or chickenpox.  You develop belly (abdominal) pain. Round ligament discomfort is a common non-cancerous (benign) cause of abdominal pain in pregnancy. Your caregiver still must evaluate you.  You develop a bad headache that does not go away.  You develop fever, diarrhea, pain with urination, or shortness of breath.  You develop visual problems, blurry, or double vision.  You fall or are in a car accident or any kind of trauma.  There is mental or physical violence at home. Document Released: 04/04/2001 Document Revised: 01/03/2012 Document Reviewed: 10/07/2008 [employer] Patient Information 2014 [employer].  

## 2012-10-31 ENCOUNTER — Encounter: Payer: Self-pay | Admitting: Women's Health

## 2012-10-31 ENCOUNTER — Other Ambulatory Visit: Payer: Self-pay

## 2012-11-01 ENCOUNTER — Other Ambulatory Visit: Payer: Self-pay

## 2012-11-04 ENCOUNTER — Other Ambulatory Visit: Payer: Self-pay

## 2012-11-04 DIAGNOSIS — Z3480 Encounter for supervision of other normal pregnancy, unspecified trimester: Secondary | ICD-10-CM

## 2012-11-04 NOTE — Addendum Note (Signed)
Addended by: Criss Alvine on: 11/04/2012 09:16 AM   Modules accepted: Orders

## 2012-11-06 ENCOUNTER — Telehealth: Payer: Self-pay | Admitting: Adult Health

## 2012-11-06 NOTE — Telephone Encounter (Signed)
Wireless customer not available

## 2012-11-06 NOTE — Telephone Encounter (Signed)
Please review sugar test. She had an abnormal reading. Thanks!!!

## 2012-11-07 NOTE — Telephone Encounter (Signed)
Pt aware of 2 hour gtt

## 2012-11-25 ENCOUNTER — Encounter: Payer: Self-pay | Admitting: Obstetrics & Gynecology

## 2012-11-29 ENCOUNTER — Encounter (HOSPITAL_COMMUNITY): Payer: Self-pay

## 2012-11-29 ENCOUNTER — Inpatient Hospital Stay (HOSPITAL_COMMUNITY)
Admission: EM | Admit: 2012-11-29 | Discharge: 2012-11-30 | Disposition: A | Discharge status: Home / Self Care | Payer: BC Managed Care – PPO | Attending: Obstetrics and Gynecology | Admitting: Obstetrics and Gynecology

## 2012-11-29 DIAGNOSIS — O99891 Other specified diseases and conditions complicating pregnancy: Secondary | ICD-10-CM | POA: Insufficient documentation

## 2012-11-29 DIAGNOSIS — M549 Dorsalgia, unspecified: Secondary | ICD-10-CM | POA: Insufficient documentation

## 2012-11-29 DIAGNOSIS — E860 Dehydration: Secondary | ICD-10-CM | POA: Insufficient documentation

## 2012-11-29 DIAGNOSIS — R109 Unspecified abdominal pain: Secondary | ICD-10-CM

## 2012-11-29 MED ORDER — MORPHINE SULFATE 4 MG/ML IJ SOLN
2.0000 mg | Freq: Once | INTRAMUSCULAR | Status: AC
  Administered 2012-11-30: 2 mg via INTRAVENOUS
  Filled 2012-11-29: qty 1

## 2012-11-29 NOTE — ED Provider Notes (Signed)
CSN: [id number]     Arrival date & time 11/29/12  2318 History  This chart was scribed for Vida Roller, MD by Bennett Scrape, ED Scribe. This patient was seen in room APA01/APA01 and the patient's care was started at 11:33 PM.   Chief Complaint  Patient presents with  . Abdominal Pain   The history is provided by the patient. No language interpreter was used.    HPI Comments: Nicole Moon is a 22 y.o. female who is [redacted] weeks pregnant presents to the Emergency Department complaining of intermittent, diffuse abdominal pain described as a tightness that is worse in the upper abdomen. She reports abdominal tightness and back pain that started last night but pt states that she was able to sleep. She states that the pain returned this morning and has been felt intermittently since. She reports more than 20 episodes of the intermittent abdominal tightness today. Her due date for this current pregnancy is Novemeber 26th. She is G2P1A0. She states that she had gestational diabetes and HELLP syndrome during her first pregnancy. Her first child was born prematurely through a vaginal birth but is otherwise healthy today. She reports nausea associated with the pregnacy but denies emesis. She denies any recent vaginal bleeding or fluid leakage and urinary symptoms. She denies having a prior h/o cholecystitis or appendicitis.   OB-GYN is Dr. Delbert Harness. She admits that she is currently dropped from the practice due to Medicaid mistake. She reports prior visits during her pregnancy, last one was 2 to 3 weeks ago. She states that she was being monitored for gestational diabetes but is unaware of any numbers.  Past Medical History  Diagnosis Date  . Asthma   . Gestational diabetes   . History of HELLP syndrome, currently pregnant    Past Surgical History  Procedure Laterality Date  . Ankle surgery    . Fracture surgery     Family History  Problem Relation Age of Onset  . Heart disease Mother   .  Heart disease Father   . Alcohol abuse Paternal Grandfather    History  Substance Use Topics  . Smoking status: Never Smoker   . Smokeless tobacco: Not on file  . Alcohol Use: No   OB History   Grav Para Term Preterm Abortions TAB SAB Ect Mult Living   2 1 0 1 0 0 0 0 0 1      Review of Systems  A complete 10 system review of systems was obtained and all systems are negative except as noted in the HPI and PMH.   Allergies  Review of patient's allergies indicates no known allergies.  Home Medications   No current outpatient prescriptions on file. Triage Vitals: BP 125/65  Pulse 96  Temp(Src) 98.5 F (36.9 C) (Oral)  Resp 18  Ht 5\' 6"  (1.676 m)  Wt 227 lb (102.967 kg)  BMI 36.66 kg/m2  SpO2 99%  LMP 08/16/2012  Physical Exam  Nursing note and vitals reviewed. Constitutional: She is oriented to person, place, and time. She appears well-developed and well-nourished. No distress.  HENT:  Head: Normocephalic and atraumatic.  Mouth/Throat: Oropharynx is clear and moist.  Eyes: Conjunctivae and EOM are normal. Pupils are equal, round, and reactive to light.  Neck: Neck supple. No tracheal deviation present.  Cardiovascular: Normal rate and regular rhythm.   No murmur heard. Pulses:      Dorsalis pedis pulses are 2+ on the right side, and 2+ on the left side.  Pulmonary/Chest:  Effort normal and breath sounds normal. No respiratory distress. She has no wheezes.  Abdominal: Soft. Bowel sounds are normal. She exhibits no distension. There is tenderness (mild general upper abdominal tenderness). There is no rebound and no guarding.  Gravid abdomen, fundus is approximately 2 inches above the umbilicus   Genitourinary:  Chaperone present for entire vaginal exam, external genitalia appear normal, no discharge bleeding or fluid present in the vaginal vault, cervical os is is closed, no bleeding, no leakage of fluid. Bimanual exam with minimal tenderness, no focal tenderness, cervix  softening  Musculoskeletal: Normal range of motion. She exhibits no edema (no ankle swelling).  Lymphadenopathy:    She has no cervical adenopathy.  Neurological: She is alert and oriented to person, place, and time. No cranial nerve deficit.  Skin: Skin is warm and dry. No rash noted.  Psychiatric: She has a normal mood and affect. Her behavior is normal.    ED Course   Procedures (including critical care time)  DIAGNOSTIC STUDIES: Oxygen Saturation is 99% on room air, normal by my interpretation.    COORDINATION OF CARE: 11:37 PM-Discussed treatment plan which includes fetal heart monitor and pelvic exam with pt at bedside and pt agreed to plan.    Labs Reviewed  URINALYSIS, ROUTINE W REFLEX MICROSCOPIC - Abnormal; Notable for the following:    Specific Gravity, Urine >1.030 (*)    Hgb urine dipstick SMALL (*)    All other components within normal limits  CBC WITH DIFFERENTIAL - Abnormal; Notable for the following:    WBC 13.6 (*)    RBC 3.76 (*)    Hemoglobin 10.9 (*)    HCT 32.7 (*)    Neutro Abs 8.3 (*)    Lymphs Abs 4.1 (*)    All other components within normal limits  COMPREHENSIVE METABOLIC PANEL - Abnormal; Notable for the following:    Sodium 133 (*)    Glucose, Bld 102 (*)    Albumin 3.2 (*)    Total Bilirubin 0.1 (*)    All other components within normal limits  LIPASE, BLOOD  URINE MICROSCOPIC-ADD ON   No results found. 1. Abdominal pain     MDM  There is positive fetal movements on exam, she is not dilated, urinalysis is pending. She will be laboratory workup, fetal monitoring and communications with [facility] hospital nursing\OB/GYN. She overall appears comfortable at this time. There is some concern that she may be having contractions given her periodic pain throughout the day today.  Laboratory data shows no signs of hellp syndrome, no urinary tract infection  D/w Dr. Emelda Fear who will accept in transfer to [facility] for further evaluation.  The monitor  shows some contractions.    Vida Roller, MD 11/30/12 [phone]

## 2012-11-29 NOTE — ED Notes (Signed)
Having abdominal pain, started last night and it eased off, went to work today and it gradually got worse per pt.  Hurting in my back also per pt. Has been nauseated, but not vomiting per pt. Has not noticed any bleeding or discharge. Patient did have an OBGYN until they cancelled her MEDICAID. Dr. Emelda Fear was seeing me. Was seen 3 weeks ago to do any early glucose test which was negative. Had gestational diabetes with my other child.

## 2012-11-30 ENCOUNTER — Inpatient Hospital Stay (HOSPITAL_COMMUNITY): Payer: BC Managed Care – PPO

## 2012-11-30 ENCOUNTER — Encounter (HOSPITAL_COMMUNITY): Payer: Self-pay | Admitting: *Deleted

## 2012-11-30 DIAGNOSIS — R109 Unspecified abdominal pain: Secondary | ICD-10-CM

## 2012-11-30 LAB — CBC WITH DIFFERENTIAL/PLATELET
Basophils Absolute: 0 10*3/uL (ref 0.0–0.1)
Eosinophils Relative: 2 % (ref 0–5)
Lymphocytes Relative: 30 % (ref 12–46)
Lymphs Abs: 4.1 10*3/uL — ABNORMAL HIGH (ref 0.7–4.0)
MCV: 87 fL (ref 78.0–100.0)
Neutro Abs: 8.3 10*3/uL — ABNORMAL HIGH (ref 1.7–7.7)
Platelets: 189 10*3/uL (ref 150–400)
RBC: 3.76 MIL/uL — ABNORMAL LOW (ref 3.87–5.11)
RDW: 14.1 % (ref 11.5–15.5)
WBC: 13.6 10*3/uL — ABNORMAL HIGH (ref 4.0–10.5)

## 2012-11-30 LAB — URINALYSIS, ROUTINE W REFLEX MICROSCOPIC
Glucose, UA: NEGATIVE mg/dL
Leukocytes, UA: NEGATIVE
Protein, ur: NEGATIVE mg/dL
Specific Gravity, Urine: >1.03 — ABNORMAL HIGH (ref 1.005–1.030)
Urobilinogen, UA: 0.2 mg/dL (ref 0.0–1.0)

## 2012-11-30 LAB — COMPREHENSIVE METABOLIC PANEL
ALT: 13 U/L (ref 0–35)
AST: 19 U/L (ref 0–37)
Alkaline Phosphatase: 105 U/L (ref 39–117)
CO2: 23 mEq/L (ref 19–32)
Chloride: 99 mEq/L (ref 96–112)
GFR calc Af Amer: >90 mL/min (ref >90)
GFR calc non Af Amer: >90 mL/min (ref >90)
Glucose, Bld: 102 mg/dL — ABNORMAL HIGH (ref 70–99)
Potassium: 3.8 mEq/L (ref 3.5–5.1)
Sodium: 133 mEq/L — ABNORMAL LOW (ref 135–145)
Total Bilirubin: 0.1 mg/dL — ABNORMAL LOW (ref 0.3–1.2)

## 2012-11-30 MED ORDER — SODIUM CHLORIDE 0.9 % IV BOLUS (SEPSIS)
1000.0000 mL | Freq: Once | INTRAVENOUS | Status: AC
  Administered 2012-11-30: 1000 mL via INTRAVENOUS

## 2012-11-30 NOTE — Progress Notes (Signed)
Patient to be further assessed in MAU with monitoring and ultrasound of cervical length.  FFN not reliable until 24 hrs s/p last exam.  MAU aware

## 2012-11-30 NOTE — MAU Provider Note (Signed)
  History     CSN: [id number]  Arrival date and time: 11/29/12 2318   None     Chief Complaint  Patient presents with  . Abdominal Pain   HPI Ms. Worthy is seen at [facility] in transfer from Dr. Hyacinth Meeker, ED physician at [facility] for continuation evaluation. Patient presented at [facility] complaining of discomfort level of 8 and the back and lower abdomen. She's received IV fluid hydration been examined and the cervix was perceived is slightly shortened. Fetal heart rate was category 1 and there was some questionable mild uterine contractions. Fetal fibronectin cannot be done due to prior digital exam but ultrasound is performed. Ultrasound shows good cervical length 3.7 cm singleton fetus in vertex presentation normal placenta well away from the cervix no bleeding. Patient patient pain has essentially resolved rated as a 3 and only in the back, dull not cyclic. Again there is no bleeding and the fetus is active.    Past Medical History  Diagnosis Date  . Asthma   . Gestational diabetes   . History of HELLP syndrome, currently pregnant     Past Surgical History  Procedure Laterality Date  . Ankle surgery    . Fracture surgery      Family History  Problem Relation Age of Onset  . Heart disease Mother   . Heart disease Father   . Alcohol abuse Paternal Grandfather     History  Substance Use Topics  . Smoking status: Never Smoker   . Smokeless tobacco: Not on file  . Alcohol Use: No    Allergies: No Known Allergies  Prescriptions prior to admission  Medication Sig Dispense Refill  . albuterol (PROVENTIL HFA;VENTOLIN HFA) 108 (90 BASE) MCG/ACT inhaler Inhale 2 puffs into the lungs every 4 (four) hours as needed for wheezing.  1 Inhaler  0  . Prenatal Vitamins (DIS) TABS Take 1 tablet by mouth daily.      . cephALEXin (KEFLEX) 500 MG capsule Take 1 capsule (500 mg total) by mouth 4 (four) times daily.  28 capsule  0    ROS Physical Exam    Blood pressure 105/48, pulse 107, temperature 97.3 F (36.3 C), temperature source Oral, resp. rate 18, height 5\' 6"  (1.676 m), weight 102.967 kg (227 lb), last menstrual period 08/16/2012, SpO2 100.00%.  Physical Exam Physical Examination: General appearance - alert, well appearing, and in no distress, oriented to person, place, and time, acyanotic, in no respiratory distress and well hydrated Abdomen - soft, nontender, nondistended, no masses or organomegaly external monitoring performed earlier showing resolution of contractions Pelvic - CERVIX: Per ultrasound 3.7 cm length, no funneling no dilation   MAU Course  Procedures  MDM Review of old records, ultrasound assessment  Assessment and Plan  Pregnancy 24 weeks, without evidence of preterm labor Mild dehydration, resolved Plan: Patient will be discharged home [facility]up [facility] within one week, patient advised to return to the [facility] hospital when necessary return of pain, bleeding, OB concerns  Kylar Leonhardt V 11/30/2012, 4:05 AM

## 2012-11-30 NOTE — ED Notes (Signed)
Ultra sound removed & toco moved down as per cathy at [employer].

## 2012-12-01 NOTE — Progress Notes (Signed)
Obstetric ultrasound performed today.   Study limited to evaluation of cervical length.   Singleton IUP at 24w 3d Normal amniotic fluid volume Reassuring cervical length  Quad Screen indicates no increased risks for fetal chromosome anomalies.  MSAFP is also WNL.     Early screen for gestational diabetes reported to be in progress.   If not previously done, recommend ultrasound evaluation of fetal anatomy.  Given increased BMI, serial evaluations of fetal growth and amniotic fluid volume may be of benefit through the third trimester.  Recommend further evaluations of cervical length based on patient's clinical course.  Follow up early screen for gestational diabetes.  If this is WNL, repeat evaluation is recommend at approximately 28 weeks.   Please see full report in [facility].

## 2012-12-04 ENCOUNTER — Ambulatory Visit (INDEPENDENT_AMBULATORY_CARE_PROVIDER_SITE_OTHER): Payer: BC Managed Care – PPO | Admitting: Advanced Practice Midwife

## 2012-12-04 ENCOUNTER — Encounter: Payer: Self-pay | Admitting: Advanced Practice Midwife

## 2012-12-04 VITALS — BP 96/60 | Wt 234.0 lb

## 2012-12-04 DIAGNOSIS — O09219 Supervision of pregnancy with history of pre-term labor, unspecified trimester: Secondary | ICD-10-CM

## 2012-12-04 DIAGNOSIS — Z8632 Personal history of gestational diabetes: Secondary | ICD-10-CM

## 2012-12-04 DIAGNOSIS — O360990 Maternal care for other rhesus isoimmunization, unspecified trimester, not applicable or unspecified: Secondary | ICD-10-CM

## 2012-12-04 DIAGNOSIS — O09899 Supervision of other high risk pregnancies, unspecified trimester: Secondary | ICD-10-CM

## 2012-12-04 DIAGNOSIS — O09299 Supervision of pregnancy with other poor reproductive or obstetric history, unspecified trimester: Secondary | ICD-10-CM

## 2012-12-04 DIAGNOSIS — Z1389 Encounter for screening for other disorder: Secondary | ICD-10-CM

## 2012-12-04 DIAGNOSIS — Z331 Pregnant state, incidental: Secondary | ICD-10-CM

## 2012-12-04 DIAGNOSIS — O4702 False labor before 37 completed weeks of gestation, second trimester: Secondary | ICD-10-CM

## 2012-12-04 LAB — POCT URINALYSIS DIPSTICK
Glucose, UA: NEGATIVE
Nitrite, UA: NEGATIVE

## 2012-12-04 NOTE — Progress Notes (Signed)
Pt seen at [facility] 8/9.14 for preterm contractions. Sent from [facility] after cx checked and felt to be shortened.  TVU showed a 3.7cm cx.  fFN not collected d/t previous cx exam.  Will collect today, just for reassurance.  Still feels like irregular ctx/back pain.  CX normal. Pt reassured and given warning s/s of PTL

## 2012-12-04 NOTE — Progress Notes (Signed)
Still having irregular contractions and backpain

## 2012-12-04 NOTE — Patient Instructions (Addendum)

## 2012-12-05 ENCOUNTER — Telehealth: Payer: Self-pay | Admitting: Obstetrics and Gynecology

## 2012-12-05 DIAGNOSIS — Z8632 Personal history of gestational diabetes: Secondary | ICD-10-CM

## 2012-12-05 DIAGNOSIS — O0992 Supervision of high risk pregnancy, unspecified, second trimester: Secondary | ICD-10-CM

## 2012-12-05 DIAGNOSIS — O360120 Maternal care for anti-D [Rh] antibodies, second trimester, not applicable or unspecified: Secondary | ICD-10-CM

## 2012-12-05 NOTE — Telephone Encounter (Signed)
Pt aware of results 

## 2012-12-25 ENCOUNTER — Other Ambulatory Visit: Payer: BC Managed Care – PPO

## 2012-12-25 ENCOUNTER — Ambulatory Visit (INDEPENDENT_AMBULATORY_CARE_PROVIDER_SITE_OTHER): Payer: BC Managed Care – PPO | Admitting: Advanced Practice Midwife

## 2012-12-25 ENCOUNTER — Encounter: Payer: Self-pay | Admitting: Advanced Practice Midwife

## 2012-12-25 VITALS — BP 108/58 | Wt 241.0 lb

## 2012-12-25 DIAGNOSIS — L309 Dermatitis, unspecified: Secondary | ICD-10-CM | POA: Insufficient documentation

## 2012-12-25 DIAGNOSIS — Z1389 Encounter for screening for other disorder: Secondary | ICD-10-CM

## 2012-12-25 DIAGNOSIS — Z8632 Personal history of gestational diabetes: Secondary | ICD-10-CM

## 2012-12-25 DIAGNOSIS — O09219 Supervision of pregnancy with history of pre-term labor, unspecified trimester: Secondary | ICD-10-CM

## 2012-12-25 DIAGNOSIS — O99019 Anemia complicating pregnancy, unspecified trimester: Secondary | ICD-10-CM

## 2012-12-25 DIAGNOSIS — Z3480 Encounter for supervision of other normal pregnancy, unspecified trimester: Secondary | ICD-10-CM

## 2012-12-25 DIAGNOSIS — Z331 Pregnant state, incidental: Secondary | ICD-10-CM

## 2012-12-25 DIAGNOSIS — O360990 Maternal care for other rhesus isoimmunization, unspecified trimester, not applicable or unspecified: Secondary | ICD-10-CM

## 2012-12-25 LAB — POCT URINALYSIS DIPSTICK
Glucose, UA: NEGATIVE
Nitrite, UA: NEGATIVE

## 2012-12-25 LAB — CBC
MCV: 85.2 fL (ref 78.0–100.0)
Platelets: 195 10*3/uL (ref 150–400)
RDW: 14.6 % (ref 11.5–15.5)
WBC: 11.9 10*3/uL — ABNORMAL HIGH (ref 4.0–10.5)

## 2012-12-25 NOTE — Progress Notes (Signed)
Doing PN2 today.    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  FH measured from umbilicus d/t large panus.  Routine questions about pregnancy answered.  F/U in 4 weeks for LROB/Rhogam injection.

## 2012-12-25 NOTE — Progress Notes (Signed)
For PN2 Today. 

## 2012-12-25 NOTE — Patient Instructions (Signed)
Eucerin  or Aquaphor cleanser; may use hydrocortisone cream for itch; keep rash moist with oil based lotion or vaseline

## 2012-12-26 LAB — GLUCOSE TOLERANCE, 2 HOURS W/ 1HR: Glucose, Fasting: 103 mg/dL — ABNORMAL HIGH (ref 70–99)

## 2012-12-26 LAB — ANTIBODY SCREEN: Antibody Screen: NEGATIVE

## 2012-12-27 ENCOUNTER — Encounter: Payer: Self-pay | Admitting: Women's Health

## 2012-12-27 ENCOUNTER — Telehealth: Payer: Self-pay | Admitting: Women's Health

## 2012-12-27 DIAGNOSIS — O24410 Gestational diabetes mellitus in pregnancy, diet controlled: Secondary | ICD-10-CM | POA: Insufficient documentation

## 2012-12-27 NOTE — Telephone Encounter (Signed)
Calling to notify pt of A1DM, no answer and no option to leave voice message.

## 2012-12-28 ENCOUNTER — Telehealth: Payer: Self-pay | Admitting: Women's Health

## 2012-12-28 NOTE — Telephone Encounter (Signed)
Notified pt of dx A1DM- to be expecting call from dietician for meeting w/in next week, borderline anemia- to increase intake of foods high in iron, and that remainder or PN2 labs normal. Pt verbalized understanding of all.

## 2013-01-01 ENCOUNTER — Telehealth: Payer: Self-pay | Admitting: Advanced Practice Midwife

## 2013-01-01 ENCOUNTER — Encounter: Payer: BC Managed Care – PPO | Attending: Obstetrics and Gynecology

## 2013-01-01 VITALS — Ht 66.0 in | Wt 239.0 lb

## 2013-01-01 DIAGNOSIS — O99810 Abnormal glucose complicating pregnancy: Secondary | ICD-10-CM | POA: Insufficient documentation

## 2013-01-01 DIAGNOSIS — Z713 Dietary counseling and surveillance: Secondary | ICD-10-CM | POA: Insufficient documentation

## 2013-01-01 DIAGNOSIS — O24410 Gestational diabetes mellitus in pregnancy, diet controlled: Secondary | ICD-10-CM

## 2013-01-01 NOTE — Telephone Encounter (Signed)
Pt wanted address to diabetes place. Pt given address.

## 2013-01-02 NOTE — Progress Notes (Signed)
  Patient was seen on 01/01/13 for Gestational Diabetes self-management class at the [facility]. The following learning objectives were met by the patient during this course:   States the definition of Gestational Diabetes  States why dietary management is important in controlling blood glucose  Describes the effects each nutrient has on blood glucose levels  Demonstrates ability to create a balanced meal plan  Demonstrates carbohydrate counting   States when to check blood glucose levels  Demonstrates proper blood glucose monitoring techniques  States the effect of stress and exercise on blood glucose levels  States the importance of limiting caffeine and abstaining from alcohol and smoking  Blood glucose monitor given: Accu Chek Nano BG Monitoring Kit Lot #  [id number] Exp: 03/23/14 Blood glucose reading: 85  Patient instructed to monitor glucose levels: FBS: 60 - <90 1 hour: <140 2 hour: <120  *Patient received handouts:  Nutrition Diabetes and Pregnancy  Carbohydrate Counting List  Meal Plan Work Sheet  Patient will be seen for follow-up as needed.

## 2013-01-22 ENCOUNTER — Encounter: Payer: BC Managed Care – PPO | Admitting: Advanced Practice Midwife

## 2013-01-29 ENCOUNTER — Encounter: Payer: Self-pay | Admitting: Advanced Practice Midwife

## 2013-01-29 ENCOUNTER — Ambulatory Visit (INDEPENDENT_AMBULATORY_CARE_PROVIDER_SITE_OTHER): Payer: BC Managed Care – PPO | Admitting: Advanced Practice Midwife

## 2013-01-29 VITALS — BP 110/70 | Wt 241.0 lb

## 2013-01-29 DIAGNOSIS — Z23 Encounter for immunization: Secondary | ICD-10-CM

## 2013-01-29 DIAGNOSIS — Z331 Pregnant state, incidental: Secondary | ICD-10-CM

## 2013-01-29 DIAGNOSIS — O0993 Supervision of high risk pregnancy, unspecified, third trimester: Secondary | ICD-10-CM

## 2013-01-29 DIAGNOSIS — Z1389 Encounter for screening for other disorder: Secondary | ICD-10-CM

## 2013-01-29 DIAGNOSIS — O09219 Supervision of pregnancy with history of pre-term labor, unspecified trimester: Secondary | ICD-10-CM

## 2013-01-29 DIAGNOSIS — O99019 Anemia complicating pregnancy, unspecified trimester: Secondary | ICD-10-CM

## 2013-01-29 DIAGNOSIS — O360990 Maternal care for other rhesus isoimmunization, unspecified trimester, not applicable or unspecified: Secondary | ICD-10-CM

## 2013-01-29 LAB — POCT URINALYSIS DIPSTICK
Glucose, UA: NEGATIVE
Nitrite, UA: NEGATIVE

## 2013-01-29 MED ORDER — RHO D IMMUNE GLOBULIN 1500 UNIT/2ML IJ SOLN
300.0000 ug | Freq: Once | INTRAMUSCULAR | Status: AC
  Administered 2013-01-29: 300 ug via INTRAMUSCULAR

## 2013-01-29 MED ORDER — INFLUENZA VAC SPLIT QUAD 0.5 ML IM SUSP
0.5000 mL | Freq: Once | INTRAMUSCULAR | Status: AC
  Administered 2013-01-29: 0.5 mL via INTRAMUSCULAR

## 2013-01-29 NOTE — Progress Notes (Signed)
Got flu shot and Rhogam today.  Went to nutritional class 9/10.  Left bs log at home.  FBS ~ 60-70.  2 hour pp ~ 90.  Doing well with diet.    Had epigastric pain the last 2 nights.  May try tums. Routine questions about pregnancy answered.  F/U in 2 weeks for LROB.Marland Kitchen

## 2013-02-12 ENCOUNTER — Ambulatory Visit (INDEPENDENT_AMBULATORY_CARE_PROVIDER_SITE_OTHER): Payer: BC Managed Care – PPO | Admitting: Advanced Practice Midwife

## 2013-02-12 ENCOUNTER — Telehealth: Payer: Self-pay

## 2013-02-12 ENCOUNTER — Encounter: Payer: Self-pay | Admitting: Advanced Practice Midwife

## 2013-02-12 VITALS — BP 110/80 | Wt 240.0 lb

## 2013-02-12 DIAGNOSIS — O99810 Abnormal glucose complicating pregnancy: Secondary | ICD-10-CM

## 2013-02-12 DIAGNOSIS — Z331 Pregnant state, incidental: Secondary | ICD-10-CM

## 2013-02-12 DIAGNOSIS — Z3483 Encounter for supervision of other normal pregnancy, third trimester: Secondary | ICD-10-CM

## 2013-02-12 DIAGNOSIS — R319 Hematuria, unspecified: Secondary | ICD-10-CM

## 2013-02-12 DIAGNOSIS — O99019 Anemia complicating pregnancy, unspecified trimester: Secondary | ICD-10-CM

## 2013-02-12 DIAGNOSIS — Z1389 Encounter for screening for other disorder: Secondary | ICD-10-CM

## 2013-02-12 DIAGNOSIS — O09219 Supervision of pregnancy with history of pre-term labor, unspecified trimester: Secondary | ICD-10-CM

## 2013-02-12 DIAGNOSIS — O360990 Maternal care for other rhesus isoimmunization, unspecified trimester, not applicable or unspecified: Secondary | ICD-10-CM

## 2013-02-12 LAB — COMPREHENSIVE METABOLIC PANEL
ALT: 23 U/L (ref 0–35)
AST: 27 U/L (ref 0–37)
Alkaline Phosphatase: 142 U/L — ABNORMAL HIGH (ref 39–117)
Calcium: 8.9 mg/dL (ref 8.4–10.5)
Chloride: 106 mEq/L (ref 96–112)
Creat: 0.61 mg/dL (ref 0.50–1.10)
Potassium: 4.3 mEq/L (ref 3.5–5.3)

## 2013-02-12 LAB — POCT URINALYSIS DIPSTICK: Protein, UA: NEGATIVE

## 2013-02-12 LAB — CBC
MCV: 85.4 fL (ref 78.0–100.0)
Platelets: 140 10*3/uL — ABNORMAL LOW (ref 150–400)
RDW: 14.3 % (ref 11.5–15.5)
WBC: 9.5 10*3/uL (ref 4.0–10.5)

## 2013-02-12 NOTE — Telephone Encounter (Signed)
Pt calling for results for labs done today. Pt informed results pending. Pt to call office back tomorrow.

## 2013-02-12 NOTE — Progress Notes (Signed)
All BS<90 except 1 and all pp <120  .  :)   CBC/CMP checked today d/t hx HELLP w/o symptoms last pregnancy.  Urine culture d/t 2+ blood.  No c/o at this time.  Routine questions about pregnancy answered.  F/U in 2 weeks for LROB. Marland Kitchen

## 2013-02-12 NOTE — Addendum Note (Signed)
Addended by: Colen Darling on: 02/12/2013 12:37 PM   Modules accepted: Orders

## 2013-02-13 LAB — URINE CULTURE

## 2013-02-26 ENCOUNTER — Encounter: Payer: Self-pay | Admitting: Advanced Practice Midwife

## 2013-02-26 ENCOUNTER — Ambulatory Visit (INDEPENDENT_AMBULATORY_CARE_PROVIDER_SITE_OTHER): Payer: BC Managed Care – PPO | Admitting: Advanced Practice Midwife

## 2013-02-26 VITALS — BP 98/80 | Wt 243.0 lb

## 2013-02-26 DIAGNOSIS — O09219 Supervision of pregnancy with history of pre-term labor, unspecified trimester: Secondary | ICD-10-CM

## 2013-02-26 DIAGNOSIS — Z1389 Encounter for screening for other disorder: Secondary | ICD-10-CM

## 2013-02-26 DIAGNOSIS — Z3483 Encounter for supervision of other normal pregnancy, third trimester: Secondary | ICD-10-CM

## 2013-02-26 DIAGNOSIS — O99810 Abnormal glucose complicating pregnancy: Secondary | ICD-10-CM

## 2013-02-26 DIAGNOSIS — O99019 Anemia complicating pregnancy, unspecified trimester: Secondary | ICD-10-CM

## 2013-02-26 DIAGNOSIS — E119 Type 2 diabetes mellitus without complications: Secondary | ICD-10-CM

## 2013-02-26 DIAGNOSIS — Z331 Pregnant state, incidental: Secondary | ICD-10-CM

## 2013-02-26 DIAGNOSIS — O360990 Maternal care for other rhesus isoimmunization, unspecified trimester, not applicable or unspecified: Secondary | ICD-10-CM

## 2013-02-26 LAB — POCT URINALYSIS DIPSTICK
Glucose, UA: NEGATIVE
Nitrite, UA: NEGATIVE

## 2013-02-26 LAB — OB RESULTS CONSOLE GC/CHLAMYDIA
Chlamydia: NEGATIVE
Gonorrhea: NEGATIVE

## 2013-02-26 NOTE — Progress Notes (Addendum)
No c/o at this time except pressure. Blood sugars good. Routine questions about pregnancy answered.  F/U in 1 weeks for LROB and [place] for EFW

## 2013-02-26 NOTE — Addendum Note (Signed)
Addended by: Jacklyn Shell on: 02/26/2013 09:16 AM   Modules accepted: Orders

## 2013-02-27 LAB — GC/CHLAMYDIA PROBE AMP
CT Probe RNA: NEGATIVE
GC Probe RNA: NEGATIVE

## 2013-02-28 LAB — STREP B DNA PROBE: GBSP: POSITIVE

## 2013-03-02 ENCOUNTER — Inpatient Hospital Stay (HOSPITAL_COMMUNITY)
Admission: AD | Admit: 2013-03-02 | Discharge: 2013-03-02 | Disposition: A | Discharge status: Home / Self Care | Payer: Medicaid Other | Source: Ambulatory Visit | Attending: Obstetrics & Gynecology | Admitting: Obstetrics & Gynecology

## 2013-03-02 ENCOUNTER — Encounter (HOSPITAL_COMMUNITY): Payer: Self-pay | Admitting: *Deleted

## 2013-03-02 DIAGNOSIS — O99891 Other specified diseases and conditions complicating pregnancy: Secondary | ICD-10-CM | POA: Insufficient documentation

## 2013-03-02 DIAGNOSIS — O24410 Gestational diabetes mellitus in pregnancy, diet controlled: Secondary | ICD-10-CM

## 2013-03-02 DIAGNOSIS — L309 Dermatitis, unspecified: Secondary | ICD-10-CM

## 2013-03-02 DIAGNOSIS — O99810 Abnormal glucose complicating pregnancy: Secondary | ICD-10-CM | POA: Insufficient documentation

## 2013-03-02 NOTE — MAU Note (Signed)
Last night around midnight had intercourse and is unsure if water broke at that time. Did not feel a gush but boyfriend thought he broke her bag of water. Denies any bleeding or any other leakage of fluid since then. Did not wear a pad in. Baby is moving well. Has been contracting irregularly off and on for 2 weeks. Does have gestational diabetes diet controlled. Last appointment was Wednesday and she was 2.5-3cm at that time. Will be seen Tuesday.

## 2013-03-04 ENCOUNTER — Ambulatory Visit (INDEPENDENT_AMBULATORY_CARE_PROVIDER_SITE_OTHER): Payer: BC Managed Care – PPO | Admitting: Obstetrics & Gynecology

## 2013-03-04 ENCOUNTER — Ambulatory Visit (INDEPENDENT_AMBULATORY_CARE_PROVIDER_SITE_OTHER): Payer: BC Managed Care – PPO

## 2013-03-04 ENCOUNTER — Other Ambulatory Visit: Payer: Self-pay | Admitting: Advanced Practice Midwife

## 2013-03-04 ENCOUNTER — Other Ambulatory Visit: Payer: Self-pay | Admitting: Obstetrics & Gynecology

## 2013-03-04 ENCOUNTER — Encounter: Payer: Self-pay | Admitting: Obstetrics & Gynecology

## 2013-03-04 VITALS — BP 112/68 | Wt 244.5 lb

## 2013-03-04 DIAGNOSIS — O360990 Maternal care for other rhesus isoimmunization, unspecified trimester, not applicable or unspecified: Secondary | ICD-10-CM

## 2013-03-04 DIAGNOSIS — O99810 Abnormal glucose complicating pregnancy: Secondary | ICD-10-CM

## 2013-03-04 DIAGNOSIS — E119 Type 2 diabetes mellitus without complications: Secondary | ICD-10-CM

## 2013-03-04 DIAGNOSIS — O99019 Anemia complicating pregnancy, unspecified trimester: Secondary | ICD-10-CM

## 2013-03-04 DIAGNOSIS — O24419 Gestational diabetes mellitus in pregnancy, unspecified control: Secondary | ICD-10-CM

## 2013-03-04 DIAGNOSIS — Z1389 Encounter for screening for other disorder: Secondary | ICD-10-CM

## 2013-03-04 DIAGNOSIS — Z331 Pregnant state, incidental: Secondary | ICD-10-CM

## 2013-03-04 DIAGNOSIS — O09219 Supervision of pregnancy with history of pre-term labor, unspecified trimester: Secondary | ICD-10-CM

## 2013-03-04 LAB — POCT URINALYSIS DIPSTICK
Glucose, UA: NEGATIVE
Nitrite, UA: NEGATIVE
Protein, UA: NEGATIVE

## 2013-03-04 NOTE — Progress Notes (Signed)
Sonogram reviewed, report done. BP weight and urine results all reviewed and noted. Patient reports good fetal movement, denies any bleeding and no rupture of membranes symptoms or regular contractions. Patient is without complaints. All questions were answered.

## 2013-03-04 NOTE — Progress Notes (Signed)
U/S(37+6wks)-vtx active fetus, EFW 7 lb 1 oz (51st%tile), fluid WNL AFI-11.3cm, anterior Gr 2 placenta, female fetus, BPP 8/8

## 2013-03-11 ENCOUNTER — Ambulatory Visit (INDEPENDENT_AMBULATORY_CARE_PROVIDER_SITE_OTHER): Payer: BC Managed Care – PPO | Admitting: Women's Health

## 2013-03-11 ENCOUNTER — Encounter: Payer: Self-pay | Admitting: Women's Health

## 2013-03-11 VITALS — BP 116/74 | Wt 247.8 lb

## 2013-03-11 DIAGNOSIS — Z331 Pregnant state, incidental: Secondary | ICD-10-CM

## 2013-03-11 DIAGNOSIS — Z1389 Encounter for screening for other disorder: Secondary | ICD-10-CM

## 2013-03-11 DIAGNOSIS — O99810 Abnormal glucose complicating pregnancy: Secondary | ICD-10-CM

## 2013-03-11 DIAGNOSIS — O1213 Gestational proteinuria, third trimester: Secondary | ICD-10-CM

## 2013-03-11 DIAGNOSIS — O09219 Supervision of pregnancy with history of pre-term labor, unspecified trimester: Secondary | ICD-10-CM

## 2013-03-11 DIAGNOSIS — O0993 Supervision of high risk pregnancy, unspecified, third trimester: Secondary | ICD-10-CM

## 2013-03-11 DIAGNOSIS — O24410 Gestational diabetes mellitus in pregnancy, diet controlled: Secondary | ICD-10-CM

## 2013-03-11 DIAGNOSIS — O360990 Maternal care for other rhesus isoimmunization, unspecified trimester, not applicable or unspecified: Secondary | ICD-10-CM

## 2013-03-11 DIAGNOSIS — O360130 Maternal care for anti-D [Rh] antibodies, third trimester, not applicable or unspecified: Secondary | ICD-10-CM

## 2013-03-11 DIAGNOSIS — O99019 Anemia complicating pregnancy, unspecified trimester: Secondary | ICD-10-CM

## 2013-03-11 LAB — POCT URINALYSIS DIPSTICK
Blood, UA: 3
Ketones, UA: NEGATIVE

## 2013-03-11 NOTE — Progress Notes (Signed)
Reports good fm. Denies uc's, lof, vb, urinary frequency, urgency, hesitancy, or dysuria.  No complaints. Didn't bring log, reports all FBS <90, only 1 2hr pp >120. Wants to be induced today. Notified that we do not induce well-controlled A1DM until 40wks. Will offer membrane sweeping next week. Reviewed labor s/s, fkc.  All questions answered. F/U in 1wk for visit, and bring log.

## 2013-03-11 NOTE — Patient Instructions (Addendum)
Call the office or go to [facility] if:  You begin to have strong, frequent contractions  Your water breaks.  Sometimes it is a big gush of fluid, sometimes it is just a trickle that keeps getting your panties wet or running down your legs  You have vaginal bleeding.  It is normal to have a small amount of spotting if your cervix was checked.   You don't feel your baby moving like normal.  If you don't, get you something to eat and drink and lay down and focus on feeling your baby move.  You should feel at least 10 movements in 2 hours.  If you don't, you should call the office or go to [facility].   Braxton Hicks Contractions Pregnancy is commonly associated with contractions of the uterus throughout the pregnancy. Towards the end of pregnancy (32 to 34 weeks), these contractions (Braxton Hicks) can develop more often and may become more forceful. This is not true labor because these contractions do not result in opening (dilatation) and thinning of the cervix. They are sometimes difficult to tell apart from true labor because these contractions can be forceful and people have different pain tolerances. You should not feel embarrassed if you go to the hospital with false labor. Sometimes, the only way to tell if you are in true labor is for your caregiver to follow the changes in the cervix. How to tell the difference between true and false labor:  False labor.  The contractions of false labor are usually shorter, irregular and not as hard as those of true labor.  They are often felt in the front of the lower abdomen and in the groin.  They may leave with walking around or changing positions while lying down.  They get weaker and are shorter lasting as time goes on.  These contractions are usually irregular.  They do not usually become progressively stronger, regular and closer together as with true labor.  True labor.  Contractions in true labor last 30 to 70 seconds,  become very regular, usually become more intense, and increase in frequency.  They do not go away with walking.  The discomfort is usually felt in the top of the uterus and spreads to the lower abdomen and low back.  True labor can be determined by your caregiver with an exam. This will show that the cervix is dilating and getting thinner. If there are no prenatal problems or other health problems associated with the pregnancy, it is completely safe to be sent home with false labor and await the onset of true labor. HOME CARE INSTRUCTIONS   Keep up with your usual exercises and instructions.  Take medications as directed.  Keep your regular prenatal appointment.  Eat and drink lightly if you think you are going into labor.  If BH contractions are making you uncomfortable:  Change your activity position from lying down or resting to walking/walking to resting.  Sit and rest in a tub of warm water.  Drink 2 to 3 glasses of water. Dehydration may cause B-H contractions.  Do slow and deep breathing several times an hour. SEEK IMMEDIATE MEDICAL CARE IF:   Your contractions continue to become stronger, more regular, and closer together.  You have a gushing, burst or leaking of fluid from the vagina.  An oral temperature above 102 F (38.9 C) develops.  You have passage of blood-tinged mucus.  You develop vaginal bleeding.  You develop continuous belly (abdominal) pain.  You have   low back pain that you never had before.  You feel the baby's head pushing down causing pelvic pressure.  The baby is not moving as much as it used to. Document Released: 04/10/2005 Document Revised: 07/03/2011 Document Reviewed: 10/02/2008 ExitCare Patient Information 2014 [employer].  

## 2013-03-13 LAB — URINE CULTURE: Colony Count: >=100000

## 2013-03-15 ENCOUNTER — Telehealth: Payer: Self-pay | Admitting: Women's Health

## 2013-03-15 DIAGNOSIS — O2343 Unspecified infection of urinary tract in pregnancy, third trimester: Secondary | ICD-10-CM

## 2013-03-15 NOTE — Telephone Encounter (Signed)
Notified pt of +urine culture. No sx. Unable to e-scribe while I am at [facility], rx for keflex 500mg  qid x 7d called in to [place] [facility].   Cheral Marker, CNM, [facility] 03/15/2013 11:05 AM

## 2013-03-17 ENCOUNTER — Ambulatory Visit (INDEPENDENT_AMBULATORY_CARE_PROVIDER_SITE_OTHER): Payer: BC Managed Care – PPO

## 2013-03-17 ENCOUNTER — Ambulatory Visit (INDEPENDENT_AMBULATORY_CARE_PROVIDER_SITE_OTHER): Payer: BC Managed Care – PPO | Admitting: Obstetrics & Gynecology

## 2013-03-17 ENCOUNTER — Telehealth: Payer: Self-pay | Admitting: *Deleted

## 2013-03-17 ENCOUNTER — Other Ambulatory Visit: Payer: Self-pay | Admitting: Obstetrics & Gynecology

## 2013-03-17 ENCOUNTER — Encounter: Payer: Self-pay | Admitting: Obstetrics & Gynecology

## 2013-03-17 VITALS — BP 120/90 | Wt 247.0 lb

## 2013-03-17 DIAGNOSIS — O99019 Anemia complicating pregnancy, unspecified trimester: Secondary | ICD-10-CM

## 2013-03-17 DIAGNOSIS — O368190 Decreased fetal movements, unspecified trimester, not applicable or unspecified: Secondary | ICD-10-CM

## 2013-03-17 DIAGNOSIS — O99891 Other specified diseases and conditions complicating pregnancy: Secondary | ICD-10-CM

## 2013-03-17 DIAGNOSIS — O360990 Maternal care for other rhesus isoimmunization, unspecified trimester, not applicable or unspecified: Secondary | ICD-10-CM

## 2013-03-17 DIAGNOSIS — Z331 Pregnant state, incidental: Secondary | ICD-10-CM

## 2013-03-17 DIAGNOSIS — Z1389 Encounter for screening for other disorder: Secondary | ICD-10-CM

## 2013-03-17 DIAGNOSIS — O09219 Supervision of pregnancy with history of pre-term labor, unspecified trimester: Secondary | ICD-10-CM

## 2013-03-17 DIAGNOSIS — O99810 Abnormal glucose complicating pregnancy: Secondary | ICD-10-CM

## 2013-03-17 LAB — POCT URINALYSIS DIPSTICK
Glucose, UA: NEGATIVE
Ketones, UA: NEGATIVE
Protein, UA: NEGATIVE

## 2013-03-17 NOTE — Telephone Encounter (Signed)
Pt states "a lot of swelling in bilateral feet, pressure and soreness hips extends to vaginal area, decrease fetal movement. Call transferred to front staff for pt to be given an appt for this afternoon.

## 2013-03-17 NOTE — Progress Notes (Signed)
U/S(39+3wks)-vtx active fetus BPP 8/8, fluid WNL AFI-11.8cm, anterior gr 2 placenta, FHR-150bpm, female fetus, EFW 7 lb 6 oz (44th%tile)

## 2013-03-19 ENCOUNTER — Encounter (HOSPITAL_COMMUNITY): Payer: Self-pay | Admitting: *Deleted

## 2013-03-19 ENCOUNTER — Encounter: Payer: Self-pay | Admitting: Obstetrics and Gynecology

## 2013-03-19 ENCOUNTER — Telehealth (HOSPITAL_COMMUNITY): Payer: Self-pay | Admitting: *Deleted

## 2013-03-19 ENCOUNTER — Ambulatory Visit (INDEPENDENT_AMBULATORY_CARE_PROVIDER_SITE_OTHER): Payer: BC Managed Care – PPO | Admitting: Obstetrics and Gynecology

## 2013-03-19 ENCOUNTER — Other Ambulatory Visit: Payer: Self-pay | Admitting: Obstetrics and Gynecology

## 2013-03-19 VITALS — BP 120/80 | Wt 244.0 lb

## 2013-03-19 DIAGNOSIS — Z331 Pregnant state, incidental: Secondary | ICD-10-CM

## 2013-03-19 DIAGNOSIS — O24410 Gestational diabetes mellitus in pregnancy, diet controlled: Secondary | ICD-10-CM

## 2013-03-19 DIAGNOSIS — O99891 Other specified diseases and conditions complicating pregnancy: Secondary | ICD-10-CM

## 2013-03-19 DIAGNOSIS — Z1389 Encounter for screening for other disorder: Secondary | ICD-10-CM

## 2013-03-19 DIAGNOSIS — O09219 Supervision of pregnancy with history of pre-term labor, unspecified trimester: Secondary | ICD-10-CM

## 2013-03-19 DIAGNOSIS — O99019 Anemia complicating pregnancy, unspecified trimester: Secondary | ICD-10-CM

## 2013-03-19 DIAGNOSIS — O99810 Abnormal glucose complicating pregnancy: Secondary | ICD-10-CM

## 2013-03-19 DIAGNOSIS — O360990 Maternal care for other rhesus isoimmunization, unspecified trimester, not applicable or unspecified: Secondary | ICD-10-CM

## 2013-03-19 LAB — POCT URINALYSIS DIPSTICK
Ketones, UA: NEGATIVE
Leukocytes, UA: NEGATIVE
Nitrite, UA: NEGATIVE
Protein, UA: NEGATIVE

## 2013-03-19 NOTE — Telephone Encounter (Signed)
Preadmission screen  

## 2013-03-19 NOTE — Progress Notes (Signed)
Pt denies any problems or concerns at this time.  

## 2013-03-19 NOTE — Patient Instructions (Signed)
Induction at 7:300 pm on Friday the 28th

## 2013-03-19 NOTE — Progress Notes (Signed)
Still active but less. Kick ct reviewed, BPP normal on 11/24. Will schedule IOL for this weekend. 40wk on Friday@ 7:30 PM. Pt aware Pitocin planned

## 2013-03-20 ENCOUNTER — Inpatient Hospital Stay (HOSPITAL_COMMUNITY): Payer: BC Managed Care – PPO | Admitting: Anesthesiology

## 2013-03-20 ENCOUNTER — Encounter (HOSPITAL_COMMUNITY): Payer: Self-pay | Admitting: *Deleted

## 2013-03-20 ENCOUNTER — Inpatient Hospital Stay (HOSPITAL_COMMUNITY)
Admission: AD | Admit: 2013-03-20 | Discharge: 2013-03-22 | DRG: 775 | Disposition: A | Discharge status: Home / Self Care | Payer: BC Managed Care – PPO | Source: Ambulatory Visit | Attending: Obstetrics and Gynecology | Admitting: Obstetrics and Gynecology

## 2013-03-20 ENCOUNTER — Encounter (HOSPITAL_COMMUNITY): Payer: BC Managed Care – PPO | Admitting: Anesthesiology

## 2013-03-20 DIAGNOSIS — L309 Dermatitis, unspecified: Secondary | ICD-10-CM

## 2013-03-20 DIAGNOSIS — Z22330 Carrier of Group B streptococcus: Secondary | ICD-10-CM

## 2013-03-20 DIAGNOSIS — O4290 Premature rupture of membranes, unspecified as to length of time between rupture and onset of labor, unspecified weeks of gestation: Principal | ICD-10-CM | POA: Diagnosis present

## 2013-03-20 DIAGNOSIS — O0993 Supervision of high risk pregnancy, unspecified, third trimester: Secondary | ICD-10-CM

## 2013-03-20 DIAGNOSIS — O99814 Abnormal glucose complicating childbirth: Secondary | ICD-10-CM

## 2013-03-20 DIAGNOSIS — O24410 Gestational diabetes mellitus in pregnancy, diet controlled: Secondary | ICD-10-CM

## 2013-03-20 DIAGNOSIS — O99892 Other specified diseases and conditions complicating childbirth: Secondary | ICD-10-CM | POA: Diagnosis present

## 2013-03-20 DIAGNOSIS — IMO0001 Reserved for inherently not codable concepts without codable children: Secondary | ICD-10-CM

## 2013-03-20 LAB — COMPREHENSIVE METABOLIC PANEL
Albumin: 2.7 g/dL — ABNORMAL LOW (ref 3.5–5.2)
Alkaline Phosphatase: 212 U/L — ABNORMAL HIGH (ref 39–117)
BUN: 11 mg/dL (ref 6–23)
Calcium: 9.4 mg/dL (ref 8.4–10.5)
Creatinine, Ser: 0.79 mg/dL (ref 0.50–1.10)
GFR calc Af Amer: >90 mL/min (ref >90)
Potassium: 3.9 mEq/L (ref 3.5–5.1)
Total Protein: 6 g/dL (ref 6.0–8.3)

## 2013-03-20 LAB — CBC
HCT: 32.2 % — ABNORMAL LOW (ref 36.0–46.0)
HCT: 32.1 % — ABNORMAL LOW (ref 36.0–46.0)
Hemoglobin: 10.7 g/dL — ABNORMAL LOW (ref 12.0–15.0)
MCH: 28.5 pg (ref 26.0–34.0)
MCH: 28.9 pg (ref 26.0–34.0)
MCHC: 33.2 g/dL (ref 30.0–36.0)
MCV: 85.6 fL (ref 78.0–100.0)
MCV: 86.8 fL (ref 78.0–100.0)
Platelets: 128 10*3/uL — ABNORMAL LOW (ref 150–400)
RBC: 3.7 MIL/uL — ABNORMAL LOW (ref 3.87–5.11)
RDW: 15.3 % (ref 11.5–15.5)
WBC: 18.3 10*3/uL — ABNORMAL HIGH (ref 4.0–10.5)

## 2013-03-20 LAB — GLUCOSE, CAPILLARY

## 2013-03-20 MED ORDER — OXYTOCIN 40 UNITS IN LACTATED RINGERS INFUSION - SIMPLE MED
1.0000 m[IU]/min | INTRAVENOUS | Status: DC
  Filled 2013-03-20: qty 1000

## 2013-03-20 MED ORDER — PENICILLIN G POTASSIUM 5000000 UNITS IJ SOLR
2.5000 10*6.[IU] | INTRAMUSCULAR | Status: DC
  Administered 2013-03-20: 2.5 10*6.[IU] via INTRAVENOUS
  Filled 2013-03-20 (×3): qty 2.5

## 2013-03-20 MED ORDER — DIPHENHYDRAMINE HCL 50 MG/ML IJ SOLN: 12.5000 mg | INTRAMUSCULAR | Status: DC | PRN

## 2013-03-20 MED ORDER — OXYTOCIN BOLUS FROM INFUSION
500.0000 mL | INTRAVENOUS | Status: DC
  Administered 2013-03-20: 500 mL via INTRAVENOUS

## 2013-03-20 MED ORDER — PENICILLIN G POTASSIUM 5000000 UNITS IJ SOLR
5.0000 10*6.[IU] | Freq: Once | INTRAVENOUS | Status: AC
  Administered 2013-03-20: 5 10*6.[IU] via INTRAVENOUS
  Filled 2013-03-20: qty 5

## 2013-03-20 MED ORDER — OXYTOCIN 40 UNITS IN LACTATED RINGERS INFUSION - SIMPLE MED: 62.5000 mL/h | INTRAVENOUS | Status: DC

## 2013-03-20 MED ORDER — FLEET ENEMA 7-19 GM/118ML RE ENEM: 1.0000 | ENEMA | RECTAL | Status: DC | PRN

## 2013-03-20 MED ORDER — LACTATED RINGERS IV SOLN
INTRAVENOUS | Status: DC
  Administered 2013-03-20: 17:00:00 via INTRAVENOUS

## 2013-03-20 MED ORDER — EPHEDRINE 5 MG/ML INJ
10.0000 mg | INTRAVENOUS | Status: DC | PRN
  Filled 2013-03-20: qty 2
  Filled 2013-03-20: qty 4

## 2013-03-20 MED ORDER — ACETAMINOPHEN 325 MG PO TABS: 650.0000 mg | ORAL_TABLET | ORAL | Status: DC | PRN

## 2013-03-20 MED ORDER — ONDANSETRON HCL 4 MG/2ML IJ SOLN
4.0000 mg | Freq: Four times a day (QID) | INTRAMUSCULAR | Status: DC | PRN
  Administered 2013-03-20: 4 mg via INTRAVENOUS
  Filled 2013-03-20: qty 2

## 2013-03-20 MED ORDER — PHENYLEPHRINE 40 MCG/ML (10ML) SYRINGE FOR IV PUSH (FOR BLOOD PRESSURE SUPPORT)
80.0000 ug | PREFILLED_SYRINGE | INTRAVENOUS | Status: DC | PRN
  Filled 2013-03-20: qty 10
  Filled 2013-03-20: qty 2

## 2013-03-20 MED ORDER — LACTATED RINGERS IV SOLN: 500.0000 mL | INTRAVENOUS | Status: DC | PRN

## 2013-03-20 MED ORDER — FENTANYL 2.5 MCG/ML BUPIVACAINE 1/10 % EPIDURAL INFUSION (WH - ANES)
14.0000 mL/h | INTRAMUSCULAR | Status: DC | PRN
  Filled 2013-03-20: qty 125

## 2013-03-20 MED ORDER — CITRIC ACID-SODIUM CITRATE 334-500 MG/5ML PO SOLN: 30.0000 mL | ORAL | Status: DC | PRN

## 2013-03-20 MED ORDER — OXYCODONE-ACETAMINOPHEN 5-325 MG PO TABS: 1.0000 | ORAL_TABLET | ORAL | Status: DC | PRN

## 2013-03-20 MED ORDER — LIDOCAINE HCL (PF) 1 % IJ SOLN
INTRAMUSCULAR | Status: DC | PRN
  Administered 2013-03-20: 3 mL
  Administered 2013-03-20: 4 mL

## 2013-03-20 MED ORDER — LIDOCAINE HCL (PF) 1 % IJ SOLN
30.0000 mL | INTRAMUSCULAR | Status: DC | PRN
  Filled 2013-03-20 (×2): qty 30

## 2013-03-20 MED ORDER — FENTANYL CITRATE 0.05 MG/ML IJ SOLN: 50.0000 ug | INTRAMUSCULAR | Status: DC | PRN

## 2013-03-20 MED ORDER — IBUPROFEN 600 MG PO TABS: 600.0000 mg | ORAL_TABLET | Freq: Four times a day (QID) | ORAL | Status: DC | PRN

## 2013-03-20 MED ORDER — FENTANYL 2.5 MCG/ML BUPIVACAINE 1/10 % EPIDURAL INFUSION (WH - ANES)
INTRAMUSCULAR | Status: DC | PRN
  Administered 2013-03-20: 12 mL/h via EPIDURAL

## 2013-03-20 MED ORDER — EPHEDRINE 5 MG/ML INJ
10.0000 mg | INTRAVENOUS | Status: DC | PRN
  Filled 2013-03-20: qty 2

## 2013-03-20 MED ORDER — LACTATED RINGERS IV SOLN
500.0000 mL | Freq: Once | INTRAVENOUS | Status: AC
  Administered 2013-03-20: 500 mL via INTRAVENOUS

## 2013-03-20 MED ORDER — TERBUTALINE SULFATE 1 MG/ML IJ SOLN: 0.2500 mg | Freq: Once | INTRAMUSCULAR | Status: AC | PRN

## 2013-03-20 MED ORDER — PHENYLEPHRINE 40 MCG/ML (10ML) SYRINGE FOR IV PUSH (FOR BLOOD PRESSURE SUPPORT)
80.0000 ug | PREFILLED_SYRINGE | INTRAVENOUS | Status: DC | PRN
  Filled 2013-03-20: qty 2

## 2013-03-20 NOTE — Anesthesia Procedure Notes (Signed)
Epidural Patient location during procedure: OB Start time: 03/20/2013 7:42 PM  Staffing Anesthesiologist: Delton Stelle A. Performed by: anesthesiologist   Preanesthetic Checklist Completed: patient identified, site marked, surgical consent, pre-op evaluation, timeout performed, IV checked, risks and benefits discussed and monitors and equipment checked  Epidural Patient position: sitting Prep: site prepped and draped and DuraPrep Patient monitoring: continuous pulse ox and blood pressure Approach: midline Injection technique: LOR air  Needle:  Needle type: Tuohy  Needle gauge: 17 G Needle length: 9 cm and 9 Needle insertion depth: 7 cm Catheter type: closed end flexible Catheter size: 19 Gauge Catheter at skin depth: 12 cm Test dose: negative and Other  Assessment Events: blood not aspirated, injection not painful, no injection resistance, negative IV test and no paresthesia  Additional Notes Patient identified. Risks and benefits discussed including failed block, incomplete  Pain control, post dural puncture headache, nerve damage, paralysis, blood pressure Changes, nausea, vomiting, reactions to medications-both toxic and allergic and post Partum back pain. All questions were answered. Patient expressed understanding and wished to proceed. Sterile technique was used throughout procedure. Epidural site was Dressed with sterile barrier dressing. No paresthesias, signs of intravascular injection Or signs of intrathecal spread were encountered.  Patient was more comfortable after the epidural was dosed. Please see RN's note for documentation of vital signs and FHR which are stable.

## 2013-03-20 NOTE — Anesthesia Preprocedure Evaluation (Signed)
Anesthesia Evaluation  Patient identified by MRN, date of birth, ID band Patient awake    Reviewed: Allergy & Precautions, H&P , Patient's Chart, lab work & pertinent test results  Airway Mallampati: III TM Distance: >3 FB Neck ROM: Full    Dental no notable dental hx. (+) Teeth Intact   Pulmonary asthma ,  breath sounds clear to auscultation  Pulmonary exam normal       Cardiovascular hypertension, Rhythm:Regular Rate:Normal  Hx/o HELLP syndrome last pregnancy   Neuro/Psych  Headaches, negative psych ROS   GI/Hepatic negative GI ROS, Neg liver ROS,   Endo/Other  diabetes, Well Controlled, GestationalMorbid obesity  Renal/GU negative Renal ROS  negative genitourinary   Musculoskeletal negative musculoskeletal ROS (+)   Abdominal (+) + obese,   Peds  Hematology  (+) anemia , Gestational Thrombocytopenia   Anesthesia Other Findings   Reproductive/Obstetrics (+) Pregnancy Hx/o HELLP syndrome previous pregnancy                           Anesthesia Physical Anesthesia Plan  ASA: III  Anesthesia Plan: Epidural   Post-op Pain Management:    Induction:   Airway Management Planned: Natural Airway  Additional Equipment:   Intra-op Plan:   Post-operative Plan:   Informed Consent: I have reviewed the patients History and Physical, chart, labs and discussed the procedure including the risks, benefits and alternatives for the proposed anesthesia with the patient or authorized representative who has indicated his/her understanding and acceptance.     Plan Discussed with: Anesthesiologist  Anesthesia Plan Comments:         Anesthesia Quick Evaluation

## 2013-03-20 NOTE — H&P (Signed)
Nicole Moon is a 22 y.o. female G44P0101 with IUP at [redacted]w[redacted]d presenting for PROM 1 hour ago. Pt states she has been having rare contractions, associated with none vaginal bleeding.  Membranes are ruptured, clear fluid, with active fetal movement.   [facility]  Prenatal History/Complications: HELLP and A1DM with 1st pregnancy; A1DM this pregnancy  Past Medical History: Past Medical History  Diagnosis Date  . Asthma   . Gestational diabetes   . History of HELLP syndrome, currently pregnant     Past Surgical History: Past Surgical History  Procedure Laterality Date  . Ankle surgery    . Fracture surgery      Obstetrical History: OB History   Grav Para Term Preterm Abortions TAB SAB Ect Mult Living   2 1 0 1 0 0 0 0 0 1       Social History: History   Social History  . Marital Status: Single    Spouse Name: N/A    Number of Children: N/A  . Years of Education: N/A   Social History Main Topics  . Smoking status: Never Smoker   . Smokeless tobacco: Never Used  . Alcohol Use: No  . Drug Use: No  . Sexual Activity: Not Currently    Birth Control/ Protection: None   Other Topics Concern  . None   Social History Narrative  . None    Family History: Family History  Problem Relation Age of Onset  . Heart disease Mother   . Heart disease Father   . Cancer Paternal Grandmother     Allergies: No Known Allergies  Prescriptions prior to admission  Medication Sig Dispense Refill  . acetaminophen (TYLENOL) 325 MG tablet Take 650 mg by mouth every 6 (six) hours as needed (pain).      Marland Kitchen albuterol (PROVENTIL HFA;VENTOLIN HFA) 108 (90 BASE) MCG/ACT inhaler Inhale into the lungs every 6 (six) hours as needed for wheezing or shortness of breath.      . Prenatal Vit-Fe Fumarate-FA (PRENATAL MULTIVITAMIN) TABS tablet Take 1 tablet by mouth daily at 12 noon.         Review of Systems   Constitutional: Negative for fever and chills Eyes: Negative  for visual disturbances Respiratory: Negative for shortness of breath, dyspnea Cardiovascular: Negative for chest pain or palpitations  Gastrointestinal: Negative for vomiting, diarrhea and constipation Genitourinary: Negative for dysuria and urgency Musculoskeletal: Negative for back pain, joint pain, myalgias  Neurological: Negative for dizziness and headaches    Blood pressure 132/72, pulse 76, temperature 98 F (36.7 C), resp. rate 24, last menstrual period 08/16/2012. General appearance: alert, cooperative, appears stated age and no distress Lungs: clear to auscultation bilaterally Heart: regular rate and rhythm Abdomen: soft, non-tender; bowel sounds normal Pelvic:  Gross ROM clear fluid Extremities: Homans sign is negative, no sign of DVT DTR's 2+ Presentation: cephalic Fetal monitoringBaseline: 140 bpm, Variability: Good {> 6 bpm), Accelerations: Reactive and Decelerations: Absent Uterine activity  q 7-9 minutes, just started Dilation: 5 Effacement (%): 100 Station: +1 Exam by:: Marijean Heath, RN   Prenatal labs: ABO, Rh: O/NEG/-- (06/13 1417) Antibody: NEG (09/03 0925) Rubella:  immune RPR: NON REAC (09/03 0925)  HBsAg: NEGATIVE (06/13 1417)  HIV: NON REACTIVE (09/03 0925)  GBS: POSITIVE (11/05 0926)  2 hr Glucola 103/223/178 Genetic screening  normal Anatomy US normal   Results for orders placed during the hospital encounter of 03/20/13 (from the past 24 hour(s))  CBC   Collection Time  03/20/13  5:25 PM      Result Value Range   WBC 9.7  4.0 - 10.5 K/uL   RBC 3.76 (*) 3.87 - 5.11 MIL/uL   Hemoglobin 10.7 (*) 12.0 - 15.0 g/dL   HCT 16.1 (*) 09.6 - 04.5 %   MCV 85.6  78.0 - 100.0 fL   MCH 28.5  26.0 - 34.0 pg   MCHC 33.2  30.0 - 36.0 g/dL   RDW 40.9  81.1 - 91.4 %   Platelets 125 (*) 150 - 400 K/uL  COMPREHENSIVE METABOLIC PANEL   Collection Time    03/20/13  5:25 PM      Result Value Range   Sodium 135  135 - 145 mEq/L   Potassium 3.9  3.5 - 5.1  mEq/L   Chloride 107  96 - 112 mEq/L   CO2 19  19 - 32 mEq/L   Glucose, Bld 89  70 - 99 mg/dL   BUN 11  6 - 23 mg/dL   Creatinine, Ser 7.82  0.50 - 1.10 mg/dL   Calcium 9.4  8.4 - 95.6 mg/dL   Total Protein 6.0  6.0 - 8.3 g/dL   Albumin 2.7 (*) 3.5 - 5.2 g/dL   AST 33  0 - 37 U/L   ALT 23  0 - 35 U/L   Alkaline Phosphatase 212 (*) 39 - 117 U/L   Total Bilirubin 0.2 (*) 0.3 - 1.2 mg/dL   GFR calc non Af Amer >90  >90 mL/min   GFR calc Af Amer >90  >90 mL/min    Assessment: Nicole Moon is a 22 y.o. G2P0101 with an IUP at [redacted]w[redacted]d presenting for PROM at term A1DM  Plan: Will not augment now d/t + GBS/favorable cx in an effort to have adequate time for antibiotics to be on board   CRESENZO-DISHMAN,Haskell Rihn 03/20/2013, 7:05 PM

## 2013-03-20 NOTE — MAU Note (Signed)
Patient presented to MAU grossly ruptured; clear fluid noted. Patient went straight to Labor and Delivery.

## 2013-03-21 ENCOUNTER — Inpatient Hospital Stay (HOSPITAL_COMMUNITY): Admission: RE | Admit: 2013-03-21 | Payer: BC Managed Care – PPO | Source: Ambulatory Visit

## 2013-03-21 ENCOUNTER — Encounter (HOSPITAL_COMMUNITY): Payer: Self-pay | Admitting: *Deleted

## 2013-03-21 LAB — CBC
Hemoglobin: 9.7 g/dL — ABNORMAL LOW (ref 12.0–15.0)
MCH: 28.4 pg (ref 26.0–34.0)
MCV: 87.4 fL (ref 78.0–100.0)
Platelets: 108 10*3/uL — ABNORMAL LOW (ref 150–400)
RBC: 3.42 MIL/uL — ABNORMAL LOW (ref 3.87–5.11)
WBC: 14.9 10*3/uL — ABNORMAL HIGH (ref 4.0–10.5)

## 2013-03-21 LAB — RPR: RPR Ser Ql: NONREACTIVE

## 2013-03-21 MED ORDER — FLEET ENEMA 7-19 GM/118ML RE ENEM: 1.0000 | ENEMA | Freq: Every day | RECTAL | Status: DC | PRN

## 2013-03-21 MED ORDER — BISACODYL 10 MG RE SUPP: 10.0000 mg | Freq: Every day | RECTAL | Status: DC | PRN

## 2013-03-21 MED ORDER — WITCH HAZEL-GLYCERIN EX PADS: 1.0000 "application " | MEDICATED_PAD | CUTANEOUS | Status: DC | PRN

## 2013-03-21 MED ORDER — RHO D IMMUNE GLOBULIN 1500 UNIT/2ML IJ SOLN
300.0000 ug | Freq: Once | INTRAMUSCULAR | Status: AC
  Administered 2013-03-21: 300 ug via INTRAMUSCULAR
  Filled 2013-03-21: qty 2

## 2013-03-21 MED ORDER — MEASLES, MUMPS & RUBELLA VAC ~~LOC~~ INJ
0.5000 mL | INJECTION | Freq: Once | SUBCUTANEOUS | Status: DC
  Filled 2013-03-21: qty 0.5

## 2013-03-21 MED ORDER — SIMETHICONE 80 MG PO CHEW
80.0000 mg | CHEWABLE_TABLET | ORAL | Status: DC | PRN
  Filled 2013-03-21: qty 1

## 2013-03-21 MED ORDER — LANOLIN HYDROUS EX OINT: TOPICAL_OINTMENT | CUTANEOUS | Status: DC | PRN

## 2013-03-21 MED ORDER — BENZOCAINE-MENTHOL 20-0.5 % EX AERO
1.0000 "application " | INHALATION_SPRAY | CUTANEOUS | Status: DC | PRN
  Administered 2013-03-21: 1 via TOPICAL
  Filled 2013-03-21: qty 56

## 2013-03-21 MED ORDER — ONDANSETRON HCL 4 MG PO TABS
4.0000 mg | ORAL_TABLET | ORAL | Status: DC | PRN
  Administered 2013-03-21: 4 mg via ORAL
  Filled 2013-03-21: qty 1

## 2013-03-21 MED ORDER — ZOLPIDEM TARTRATE 5 MG PO TABS: 5.0000 mg | ORAL_TABLET | Freq: Every evening | ORAL | Status: DC | PRN

## 2013-03-21 MED ORDER — DIPHENHYDRAMINE HCL 25 MG PO CAPS: 25.0000 mg | ORAL_CAPSULE | Freq: Four times a day (QID) | ORAL | Status: DC | PRN

## 2013-03-21 MED ORDER — OXYCODONE-ACETAMINOPHEN 5-325 MG PO TABS
1.0000 | ORAL_TABLET | ORAL | Status: DC | PRN
  Administered 2013-03-21 (×2): 2 via ORAL
  Administered 2013-03-22: 1 via ORAL
  Administered 2013-03-22: 2 via ORAL
  Filled 2013-03-21: qty 1
  Filled 2013-03-21 (×3): qty 2

## 2013-03-21 MED ORDER — PRENATAL MULTIVITAMIN CH
1.0000 | ORAL_TABLET | Freq: Every day | ORAL | Status: DC
  Administered 2013-03-21 – 2013-03-22 (×2): 1 via ORAL
  Filled 2013-03-21 (×2): qty 1

## 2013-03-21 MED ORDER — ONDANSETRON HCL 4 MG/2ML IJ SOLN: 4.0000 mg | INTRAMUSCULAR | Status: DC | PRN

## 2013-03-21 MED ORDER — IBUPROFEN 600 MG PO TABS
600.0000 mg | ORAL_TABLET | Freq: Four times a day (QID) | ORAL | Status: DC
  Administered 2013-03-21 – 2013-03-22 (×7): 600 mg via ORAL
  Filled 2013-03-21 (×7): qty 1

## 2013-03-21 MED ORDER — SENNOSIDES-DOCUSATE SODIUM 8.6-50 MG PO TABS
2.0000 | ORAL_TABLET | ORAL | Status: DC
  Administered 2013-03-21: 2 via ORAL
  Filled 2013-03-21: qty 2

## 2013-03-21 MED ORDER — METHYLERGONOVINE MALEATE 0.2 MG PO TABS: 0.2000 mg | ORAL_TABLET | ORAL | Status: DC | PRN

## 2013-03-21 MED ORDER — OXYTOCIN 40 UNITS IN LACTATED RINGERS INFUSION - SIMPLE MED: 62.5000 mL/h | INTRAVENOUS | Status: DC | PRN

## 2013-03-21 MED ORDER — FERROUS SULFATE 325 (65 FE) MG PO TABS
325.0000 mg | ORAL_TABLET | Freq: Two times a day (BID) | ORAL | Status: DC
  Administered 2013-03-21 – 2013-03-22 (×4): 325 mg via ORAL
  Filled 2013-03-21 (×4): qty 1

## 2013-03-21 MED ORDER — DIBUCAINE 1 % RE OINT: 1.0000 "application " | TOPICAL_OINTMENT | RECTAL | Status: DC | PRN

## 2013-03-21 MED ORDER — METHYLERGONOVINE MALEATE 0.2 MG/ML IJ SOLN: 0.2000 mg | INTRAMUSCULAR | Status: DC | PRN

## 2013-03-21 MED ORDER — TETANUS-DIPHTH-ACELL PERTUSSIS 5-2.5-18.5 LF-MCG/0.5 IM SUSP: 0.5000 mL | Freq: Once | INTRAMUSCULAR | Status: DC

## 2013-03-21 MED ORDER — ALBUTEROL SULFATE HFA 108 (90 BASE) MCG/ACT IN AERS
2.0000 | INHALATION_SPRAY | Freq: Four times a day (QID) | RESPIRATORY_TRACT | Status: DC | PRN
  Filled 2013-03-21: qty 6.7

## 2013-03-21 NOTE — Anesthesia Postprocedure Evaluation (Signed)
Anesthesia Post Note  Patient: Nicole Moon  Procedure(s) Performed: * No procedures listed *  Anesthesia type: Epidural  Patient location: Mother/Baby  Post pain: Pain level controlled  Post assessment: Post-op Vital signs reviewed  Last Vitals:  Filed Vitals:   03/21/13 0553  BP: 130/84  Pulse: 81  Temp: 36.9 C  Resp: 18    Post vital signs: Reviewed  Level of consciousness:alert  Complications: No apparent anesthesia complications

## 2013-03-21 NOTE — Progress Notes (Addendum)
Post Partum Day 1 Subjective: Pt seen at bedside. Delivered late last night. Tired but feels okay. This morning is with no complaints, up ad lib, voiding and + flatus.   Objective: Blood pressure 130/84, pulse 81, temperature 98.5 F (36.9 C), temperature source Oral, resp. rate 18, last menstrual period 08/16/2012, SpO2 99.00%.  Physical Exam:  General: alert, cooperative and no distress Lochia: appropriate Uterine Fundus: firm Incision: n/a DVT Evaluation: No evidence of DVT seen on physical exam. Negative Homan's sign. No cords or calf tenderness. Calf/Ankle edema is present 1+, symmetric   Recent Labs  03/20/13 2305 03/21/13 0530  HGB 10.7* 9.7*  HCT 32.1* 29.9*    Assessment/Plan: Doing well. Continue current care. Likely discharge Sunday 11/30 (GBS positive, baby won't be 48h old until 11/29 after 10 PM). Bottle feeding. Contraception planning Nexplanon.   LOS: 1 day   Bobbye Morton, MD PGY-2, [facility] 03/21/2013, 7:20 AM  I have seen and examined this patient and agree the above assessment. CRESENZO-DISHMAN,Amillion Scobee 03/21/2013 7:39 AM

## 2013-03-22 LAB — RH IG WORKUP (INCLUDES ABO/RH)
Antibody Screen: POSITIVE
Fetal Screen: NEGATIVE
Unit division: 0

## 2013-03-22 MED ORDER — OXYCODONE-ACETAMINOPHEN 5-325 MG PO TABS: 1.0000 | ORAL_TABLET | ORAL | Status: DC | PRN

## 2013-03-22 MED ORDER — IBUPROFEN 600 MG PO TABS: 600.0000 mg | ORAL_TABLET | Freq: Four times a day (QID) | ORAL | Status: DC

## 2013-03-22 NOTE — Progress Notes (Cosign Needed)
Post Partum Day 1 Subjective: Pt seen at bedside. This morning is with no complaints, up ad lib, voiding and + flatus. Doing well.   Objective: Blood pressure 123/73, pulse 66, temperature 98.3 F (36.8 C), temperature source Oral, resp. rate 18, last menstrual period 08/16/2012, SpO2 99.00%, unknown if currently breastfeeding.  Physical Exam:  General: alert, cooperative and no distress Lochia: appropriate Uterine Fundus: firm Incision: n/a DVT Evaluation: No evidence of DVT seen on physical exam. Negative Homan's sign. No cords or calf tenderness. Calf/Ankle edema is present 1+, symmetric   Recent Labs  03/20/13 2305 03/21/13 0530  HGB 10.7* 9.7*  HCT 32.1* 29.9*    Assessment/Plan: Doing well. Continue current care. Plan for discharge tommorow 11/30 (GBS positive and inadequate treatment, baby won't be 48h old until late PM tonight). Bottle feeding. Contraception planning Nexplanon.   LOS: 2 days   Bobbye Morton, MD PGY-2, [facility] 03/22/2013, 7:32 AM

## 2013-03-22 NOTE — Discharge Summary (Signed)
Obstetric Discharge Summary Reason for Admission: onset of labor Prenatal Procedures: ultrasound Intrapartum Procedures: spontaneous vaginal delivery Postpartum Procedures: none Complications-Operative and Postpartum: none Hemoglobin  Date Value Range Status  03/21/2013 9.7* 12.0 - 15.0 g/dL Final     HCT  Date Value Range Status  03/21/2013 29.9* 36.0 - 46.0 % Final    Physical Exam:  General: alert, cooperative, appears stated age and no distress Lochia: appropriate Uterine Fundus: firm Incision: n/a DVT Evaluation: No evidence of DVT seen on physical exam. Negative Homan's sign. No significant calf/ankle edema.  Discharge Diagnoses: Term Pregnancy-delivered  Discharge Information: Date: 03/22/2013 Activity: pelvic rest Diet: routine Medications: PNV, Colace and Percocet Condition: stable and improved Instructions: refer to practice specific booklet Discharge to: home Follow-up Information   Follow up with Tilda Burrow, MD. Schedule an appointment as soon as possible for a visit in 6 weeks. (for postpartum visit)    Specialties:  Obstetrics and Gynecology, Radiology   Contact information:   [address] [place] [address] [phone]       Newborn Data: Live born female  Birth Weight: 8 lb 0.6 oz (3646 g) APGAR: 8, 9  Home with mother.  Nicole Moon 03/22/2013, 9:45 AM

## 2013-04-16 ENCOUNTER — Encounter: Payer: BC Managed Care – PPO | Admitting: Obstetrics and Gynecology

## 2013-05-26 ENCOUNTER — Ambulatory Visit (INDEPENDENT_AMBULATORY_CARE_PROVIDER_SITE_OTHER): Payer: BC Managed Care – PPO | Admitting: Women's Health

## 2013-05-26 ENCOUNTER — Encounter (INDEPENDENT_AMBULATORY_CARE_PROVIDER_SITE_OTHER): Payer: BC Managed Care – PPO | Admitting: Women's Health

## 2013-05-26 ENCOUNTER — Encounter: Payer: Self-pay | Admitting: Women's Health

## 2013-05-26 VITALS — BP 110/56 | Ht 66.0 in | Wt 237.0 lb

## 2013-05-26 DIAGNOSIS — O0990 Supervision of high risk pregnancy, unspecified, unspecified trimester: Secondary | ICD-10-CM

## 2013-05-26 NOTE — Progress Notes (Signed)
Patient ID: Nicole Moon, female   DOB: 1991/01/17, 23 y.o.   MRN: [id number] Subjective:    Nicole DredgeBrittany Hightower is a 23 y.o. [id number] Caucasian female who presents for a postpartum visit. She is 8.5 weeks postpartum following a spontaneous vaginal delivery at 39.6 gestational weeks. Anesthesia: epidural. I have fully reviewed the prenatal and intrapartum course. A1DM during pregnancy.  Postpartum course has been uncomplicated, was unable to make it earlier to pp visit d/t having to fill in for coworkers d/t deaths in their families. Baby's course has been uncomplicated. Baby is feeding by bottle. Bleeding no bleeding. Bowel function is normal. Bladder function is normal. Patient is sexually active. Last sex 1/31, used condom. Contraception method is desires nexplanon. Postpartum depression screening: negative.  Last pap 2013 per pt and she thinks it may have been abnormal.  The following portions of the patient's history were reviewed and updated as appropriate: allergies, current medications, past medical history, past surgical history and problem list.  Review of Systems Pertinent items are noted in HPI.   Filed Vitals:   05/26/13 1535  BP: 110/56  Height: 5\' 6"  (1.676 m)  Weight: 237 lb (107.502 kg)    Objective:     General:  alert, cooperative and no distress  Pelvic deferred at this time, no complaints Assessment:   Postpartum exam 8.5 wks s/p SVD Depression screening Contraception counseling  Needs pap  Plan:   Contraception: desires nexplanon, ordered today, will take ~2wks to come in, so encouraged no sex after 2/6 Follow up in: 2 weeks for pap & physical, then 3wks for bhcg am, 2hr gtt d/t A1DM, and nexplanon insertion   Marge DuncansBooker, Jarian Longoria Randall CNM, [facility]NP[facility]BC 05/26/2013 3:58 PM

## 2013-05-26 NOTE — Patient Instructions (Signed)
Etonogestrel implant- Nexplanon What is this medicine? ETONOGESTREL (et oh noe JES trel) is a contraceptive (birth control) device. It is used to prevent pregnancy. It can be used for up to 3 years. This medicine may be used for other purposes; ask your health care provider or pharmacist if you have questions. COMMON BRAND NAME(S): Implanon, Nexplanon  What should I tell my health care provider before I take this medicine? They need to know if you have any of these conditions: -abnormal vaginal bleeding -blood vessel disease or blood clots -cancer of the breast, cervix, or liver -depression -diabetes -gallbladder disease -headaches -heart disease or recent heart attack -high blood pressure -high cholesterol -kidney disease -liver disease -renal disease -seizures -tobacco smoker -an unusual or allergic reaction to etonogestrel, other hormones, anesthetics or antiseptics, medicines, foods, dyes, or preservatives -pregnant or trying to get pregnant -breast-feeding How should I use this medicine? This device is inserted just under the skin on the inner side of your upper arm by a health care professional. Talk to your pediatrician regarding the use of this medicine in children. Special care may be needed. Overdosage: If you think you've taken too much of this medicine contact a poison control center or emergency room at once. Overdosage: If you think you have taken too much of this medicine contact a poison control center or emergency room at once. NOTE: This medicine is only for you. Do not share this medicine with others. What if I miss a dose? This does not apply. What may interact with this medicine? Do not take this medicine with any of the following medications: -amprenavir -bosentan -fosamprenavir This medicine may also interact with the following medications: -barbiturate medicines for inducing sleep or treating seizures -certain medicines for fungal infections like  ketoconazole and itraconazole -griseofulvin -medicines to treat seizures like carbamazepine, felbamate, oxcarbazepine, phenytoin, topiramate -modafinil -phenylbutazone -rifampin -some medicines to treat HIV infection like atazanavir, indinavir, lopinavir, nelfinavir, tipranavir, ritonavir -St. John's wort This list may not describe all possible interactions. Give your health care provider a list of all the medicines, herbs, non-prescription drugs, or dietary supplements you use. Also tell them if you smoke, drink alcohol, or use illegal drugs. Some items may interact with your medicine. What should I watch for while using this medicine? This product does not protect you against HIV infection (AIDS) or other sexually transmitted diseases. You should be able to feel the implant by pressing your fingertips over the skin where it was inserted. Tell your doctor if you cannot feel the implant. What side effects may I notice from receiving this medicine? Side effects that you should report to your doctor or health care professional as soon as possible: -allergic reactions like skin rash, itching or hives, swelling of the face, lips, or tongue -breast lumps -changes in vision -confusion, trouble speaking or understanding -dark urine -depressed mood -general ill feeling or flu-like symptoms -light-colored stools -loss of appetite, nausea -right upper belly pain -severe headaches -severe pain, swelling, or tenderness in the abdomen -shortness of breath, chest pain, swelling in a leg -signs of pregnancy -sudden numbness or weakness of the face, arm or leg -trouble walking, dizziness, loss of balance or coordination -unusual vaginal bleeding, discharge -unusually weak or tired -yellowing of the eyes or skin Side effects that usually do not require medical attention (Report these to your doctor or health care professional if they continue or are bothersome.): -acne -breast pain -changes in  weight -cough -fever or chills -headache -irregular menstrual bleeding -itching,   burning, and vaginal discharge -pain or difficulty passing urine -sore throat This list may not describe all possible side effects. Call your doctor for medical advice about side effects. You may report side effects to FDA at 1-800-FDA-1088. Where should I keep my medicine? This drug is given in a hospital or clinic and will not be stored at home. NOTE: This sheet is a summary. It may not cover all possible information. If you have questions about this medicine, talk to your doctor, pharmacist, or health care provider.  2014, [employer]. (2011-10-16 15:37:45)  

## 2013-05-27 NOTE — Progress Notes (Signed)
This encounter was created in error - please disregard.

## 2013-06-09 ENCOUNTER — Encounter: Payer: Self-pay | Admitting: *Deleted

## 2013-06-09 ENCOUNTER — Other Ambulatory Visit: Payer: BC Managed Care – PPO | Admitting: Women's Health

## 2013-09-28 ENCOUNTER — Emergency Department (HOSPITAL_COMMUNITY)
Admission: EM | Admit: 2013-09-28 | Discharge: 2013-09-28 | Disposition: A | Discharge status: Home / Self Care | Payer: BC Managed Care – PPO | Attending: Emergency Medicine | Admitting: Emergency Medicine

## 2013-09-28 ENCOUNTER — Emergency Department (HOSPITAL_COMMUNITY): Payer: BC Managed Care – PPO

## 2013-09-28 ENCOUNTER — Encounter (HOSPITAL_COMMUNITY): Payer: Self-pay | Admitting: Emergency Medicine

## 2013-09-28 DIAGNOSIS — Z8632 Personal history of gestational diabetes: Secondary | ICD-10-CM | POA: Insufficient documentation

## 2013-09-28 DIAGNOSIS — Z3202 Encounter for pregnancy test, result negative: Secondary | ICD-10-CM | POA: Insufficient documentation

## 2013-09-28 DIAGNOSIS — Z791 Long term (current) use of non-steroidal anti-inflammatories (NSAID): Secondary | ICD-10-CM | POA: Insufficient documentation

## 2013-09-28 DIAGNOSIS — J45909 Unspecified asthma, uncomplicated: Secondary | ICD-10-CM | POA: Insufficient documentation

## 2013-09-28 DIAGNOSIS — Z79899 Other long term (current) drug therapy: Secondary | ICD-10-CM | POA: Insufficient documentation

## 2013-09-28 DIAGNOSIS — Z8781 Personal history of (healed) traumatic fracture: Secondary | ICD-10-CM | POA: Insufficient documentation

## 2013-09-28 DIAGNOSIS — M549 Dorsalgia, unspecified: Secondary | ICD-10-CM | POA: Insufficient documentation

## 2013-09-28 DIAGNOSIS — N201 Calculus of ureter: Secondary | ICD-10-CM | POA: Insufficient documentation

## 2013-09-28 LAB — CBC WITH DIFFERENTIAL/PLATELET
BASOS ABS: 0 10*3/uL (ref 0.0–0.1)
Basophils Relative: 0 % (ref 0–1)
EOS PCT: 1 % (ref 0–5)
Eosinophils Absolute: 0.1 10*3/uL (ref 0.0–0.7)
HCT: 37.2 % (ref 36.0–46.0)
HEMOGLOBIN: 12.1 g/dL (ref 12.0–15.0)
Lymphocytes Relative: 27 % (ref 12–46)
Lymphs Abs: 2.8 10*3/uL (ref 0.7–4.0)
MCH: 27.6 pg (ref 26.0–34.0)
MCHC: 32.5 g/dL (ref 30.0–36.0)
MCV: 84.7 fL (ref 78.0–100.0)
Monocytes Absolute: 0.6 10*3/uL (ref 0.1–1.0)
Monocytes Relative: 6 % (ref 3–12)
Neutro Abs: 7 10*3/uL (ref 1.7–7.7)
Neutrophils Relative %: 66 % (ref 43–77)
PLATELETS: 171 10*3/uL (ref 150–400)
RBC: 4.39 MIL/uL (ref 3.87–5.11)
RDW: 13.9 % (ref 11.5–15.5)
WBC: 10.4 10*3/uL (ref 4.0–10.5)

## 2013-09-28 LAB — BASIC METABOLIC PANEL
BUN: 14 mg/dL (ref 6–23)
CO2: 24 meq/L (ref 19–32)
Calcium: 9.3 mg/dL (ref 8.4–10.5)
Chloride: 102 mEq/L (ref 96–112)
Creatinine, Ser: 0.77 mg/dL (ref 0.50–1.10)
GFR calc Af Amer: >90 mL/min (ref >90)
GFR calc non Af Amer: >90 mL/min (ref >90)
Glucose, Bld: 121 mg/dL — ABNORMAL HIGH (ref 70–99)
POTASSIUM: 4.1 meq/L (ref 3.7–5.3)
SODIUM: 139 meq/L (ref 137–147)

## 2013-09-28 LAB — URINALYSIS, ROUTINE W REFLEX MICROSCOPIC
Bilirubin Urine: NEGATIVE
Glucose, UA: NEGATIVE mg/dL
Ketones, ur: NEGATIVE mg/dL
Leukocytes, UA: NEGATIVE
Nitrite: NEGATIVE
Protein, ur: 30 mg/dL — AB
Specific Gravity, Urine: >1.03 — ABNORMAL HIGH (ref 1.005–1.030)
UROBILINOGEN UA: 0.2 mg/dL (ref 0.0–1.0)
pH: 6 (ref 5.0–8.0)

## 2013-09-28 LAB — URINE MICROSCOPIC-ADD ON

## 2013-09-28 LAB — PREGNANCY, URINE: PREG TEST UR: NEGATIVE

## 2013-09-28 MED ORDER — NAPROXEN 500 MG PO TABS: 500.0000 mg | ORAL_TABLET | Freq: Two times a day (BID) | ORAL | Status: DC

## 2013-09-28 MED ORDER — HYDROCODONE-ACETAMINOPHEN 5-325 MG PO TABS
1.0000 | ORAL_TABLET | Freq: Once | ORAL | Status: AC
  Administered 2013-09-28: 1 via ORAL
  Filled 2013-09-28: qty 1

## 2013-09-28 NOTE — ED Notes (Signed)
Low abdominal pain with urinary frequency

## 2013-09-28 NOTE — Discharge Instructions (Signed)
Ureteral Colic Ureteral colic is spasm-like pain from the kidney or the ureter. This is often caused by a kidney stone. The pain is caused by the stone trying to get through the tubes that pass your pee. HOME CARE   Drink enough fluids to keep your pee (urine) clear or pale yellow.  Strain all your pee. A strainer will be provided. Keep anything caught in the strainer and bring it to your doctor. The stone causing the pain may be very small.  Only take medicine as told by your doctor.  Follow up with your doctor as told. GET HELP RIGHT AWAY IF:   Pain is not controlled with medicine.  Pain continues or gets worse.  The pain changes and there is chest or belly (abdominal) pain.  You pass out (faint).  You cannot pee.  You keep throwing up (vomiting).  You have a temperature by mouth above 102 F (38.9 C), not controlled by medicine. MAKE SURE YOU:   Understand these instructions.  Will watch this condition.  Will get help right away if you are not doing well or get worse. Document Released: 09/27/2007 Document Revised: 07/03/2011 Document Reviewed: 09/27/2007 [facility] Patient Information 2014 [place], [place]. Take the Naprosyn as needed. Follow up with your doctor if not improved in a few days. Return for any newer worse symptoms. Work note provided.

## 2013-09-28 NOTE — ED Provider Notes (Signed)
CSN: [id number]     Arrival date & time 09/28/13  1347 History   First MD Initiated Contact with Patient 09/28/13 1510     Chief Complaint  Patient presents with  . Abdominal Pain     (Consider location/radiation/quality/duration/timing/severity/associated sxs/prior Treatment) Patient is a 23 y.o. female presenting with abdominal pain. The history is provided by the patient.  Abdominal Pain Associated symptoms: dysuria, hematuria and nausea   Associated symptoms: no chest pain, no fever and no shortness of breath    Patient with complaint of left lower corner suprapubic abdominal pain. Currently about 4/10. Also several days ago patient had increased left-sided flank pain. Patient's had some nausea no vomiting no fever patient's concerned about having a urinary tract infection. Patient's also noticed some blood in her urine. Last measured. Was on the 15th.  Past Medical History  Diagnosis Date  . Asthma   . Gestational diabetes   . History of HELLP syndrome, currently pregnant    Past Surgical History  Procedure Laterality Date  . Ankle surgery    . Fracture surgery     Family History  Problem Relation Age of Onset  . Heart disease Mother   . Heart disease Father   . Cancer Paternal Grandmother    History  Substance Use Topics  . Smoking status: Never Smoker   . Smokeless tobacco: Never Used  . Alcohol Use: No   OB History   Grav Para Term Preterm Abortions TAB SAB Ect Mult Living   2 2 1 1  0 0 0 0 0 2     Review of Systems  Constitutional: Negative for fever.  HENT: Negative for congestion.   Eyes: Negative for visual disturbance.  Respiratory: Negative for shortness of breath.   Cardiovascular: Negative for chest pain.  Gastrointestinal: Positive for nausea and abdominal pain.  Genitourinary: Positive for dysuria, hematuria and flank pain.  Musculoskeletal: Positive for back pain.  Skin: Negative for rash.  Neurological: Negative for headaches.  Hematological:  Does not bruise/bleed easily.  Psychiatric/Behavioral: Negative for confusion.      Allergies  Review of patient's allergies indicates no known allergies.  Home Medications   Prior to Admission medications   Medication Sig Start Date End Date Taking? Authorizing Provider  acetaminophen (TYLENOL) 325 MG tablet Take 650 mg by mouth every 6 (six) hours as needed for mild pain or moderate pain (pain).    Yes Historical Provider, MD  HYDROcodone-acetaminophen (NORCO) 10-325 MG per tablet Take 1 tablet by mouth every 6 (six) hours as needed. pain   Yes Historical Provider, MD  naproxen (NAPROSYN) 500 MG tablet Take 1 tablet (500 mg total) by mouth 2 (two) times daily. 09/28/13   Vanetta Mulders, MD   BP 112/58  Pulse 71  Temp(Src) 98 F (36.7 C) (Oral)  Resp 18  Ht 5\' 5"  (1.651 m)  Wt 247 lb (112.038 kg)  BMI 41.10 kg/m2  SpO2 97%  LMP 09/05/2013 Physical Exam  Nursing note and vitals reviewed. Constitutional: She is oriented to person, place, and time. She appears well-developed and well-nourished. No distress.  HENT:  Head: Normocephalic and atraumatic.  Mouth/Throat: Oropharynx is clear and moist.  Eyes: Conjunctivae and EOM are normal. Pupils are equal, round, and reactive to light.  Neck: Normal range of motion. Neck supple.  Cardiovascular: Normal rate and normal heart sounds.   Pulmonary/Chest: Effort normal and breath sounds normal.  Abdominal: Soft. Bowel sounds are normal. There is no tenderness.  Musculoskeletal: Normal range of  motion. She exhibits no tenderness.  Neurological: She is alert and oriented to person, place, and time. No cranial nerve deficit. She exhibits normal muscle tone. Coordination normal.  Skin: Skin is warm. No rash noted.    ED Course  Procedures (including critical care time) Labs Review Labs Reviewed  URINALYSIS, ROUTINE W REFLEX MICROSCOPIC - Abnormal; Notable for the following:    APPearance HAZY (*)    Specific Gravity, Urine >1.030 (*)     Hgb urine dipstick LARGE (*)    Protein, ur 30 (*)    All other components within normal limits  URINE MICROSCOPIC-ADD ON - Abnormal; Notable for the following:    Squamous Epithelial / LPF FEW (*)    All other components within normal limits  BASIC METABOLIC PANEL - Abnormal; Notable for the following:    Glucose, Bld 121 (*)    All other components within normal limits  PREGNANCY, URINE  CBC WITH DIFFERENTIAL   Results for orders placed during the hospital encounter of 09/28/13  URINALYSIS, ROUTINE W REFLEX MICROSCOPIC      Result Value Ref Range   Color, Urine YELLOW  YELLOW   APPearance HAZY (*) CLEAR   Specific Gravity, Urine >1.030 (*) 1.005 - 1.030   pH 6.0  5.0 - 8.0   Glucose, UA NEGATIVE  NEGATIVE mg/dL   Hgb urine dipstick LARGE (*) NEGATIVE   Bilirubin Urine NEGATIVE  NEGATIVE   Ketones, ur NEGATIVE  NEGATIVE mg/dL   Protein, ur 30 (*) NEGATIVE mg/dL   Urobilinogen, UA 0.2  0.0 - 1.0 mg/dL   Nitrite NEGATIVE  NEGATIVE   Leukocytes, UA NEGATIVE  NEGATIVE  PREGNANCY, URINE      Result Value Ref Range   Preg Test, Ur NEGATIVE  NEGATIVE  URINE MICROSCOPIC-ADD ON      Result Value Ref Range   Squamous Epithelial / LPF FEW (*) RARE   WBC, UA 0-2  <3 WBC/hpf   RBC / HPF 21-50  <3 RBC/hpf   Bacteria, UA RARE  RARE   Urine-Other LESS THAN 10 mL OF URINE SUBMITTED    BASIC METABOLIC PANEL      Result Value Ref Range   Sodium 139  137 - 147 mEq/L   Potassium 4.1  3.7 - 5.3 mEq/L   Chloride 102  96 - 112 mEq/L   CO2 24  19 - 32 mEq/L   Glucose, Bld 121 (*) 70 - 99 mg/dL   BUN 14  6 - 23 mg/dL   Creatinine, Ser 1.610.77  0.50 - 1.10 mg/dL   Calcium 9.3  8.4 - 09.610.5 mg/dL   GFR calc non Af Amer >90  >90 mL/min   GFR calc Af Amer >90  >90 mL/min  CBC WITH DIFFERENTIAL      Result Value Ref Range   WBC 10.4  4.0 - 10.5 K/uL   RBC 4.39  3.87 - 5.11 MIL/uL   Hemoglobin 12.1  12.0 - 15.0 g/dL   HCT 04.537.2  40.936.0 - 81.146.0 %   MCV 84.7  78.0 - 100.0 fL   MCH 27.6  26.0 - 34.0  pg   MCHC 32.5  30.0 - 36.0 g/dL   RDW 91.413.9  78.211.5 - 95.615.5 %   Platelets 171  150 - 400 K/uL   Neutrophils Relative % 66  43 - 77 %   Neutro Abs 7.0  1.7 - 7.7 K/uL   Lymphocytes Relative 27  12 - 46 %   Lymphs Abs 2.8  0.7 - 4.0 K/uL   Monocytes Relative 6  3 - 12 %   Monocytes Absolute 0.6  0.1 - 1.0 K/uL   Eosinophils Relative 1  0 - 5 %   Eosinophils Absolute 0.1  0.0 - 0.7 K/uL   Basophils Relative 0  0 - 1 %   Basophils Absolute 0.0  0.0 - 0.1 K/uL     Imaging Review Ct Abdomen Pelvis Wo Contrast  09/28/2013   CLINICAL DATA:  Abdominal pain and back pain with difficulty urinating.  EXAM: CT ABDOMEN AND PELVIS WITHOUT CONTRAST  TECHNIQUE: Multidetector CT imaging of the abdomen and pelvis was performed following the standard protocol without IV contrast.  COMPARISON:  None.  FINDINGS: Lung bases are normal.  Abdominal images demonstrate subtle hepatic steatosis. The spleen, pancreas, gallbladder and adrenal glands are within normal. Appendix is normal. Kidneys are normal. There subtle prominence of the left ureter. Right ureter is normal. There is a 5-6 mm calcification over the dependent portion gallbladder in the midline likely a stone within the bladder which may have been recently passed from the left collecting system. This stone is over the region of the trigone. Remaining pelvic structures are unremarkable. Bones soft tissues are within normal.  IMPRESSION: 5-6 mm stone over the midline dependent portion of the bladder in the region of the trigone likely recently passed from the left collecting system.  Minimal hepatic steatosis.   Electronically Signed   By: Elberta Fortis M.D.   On: 09/28/2013 16:11     EKG Interpretation None      MDM   Final diagnoses:  Ureteral calculus, left      The patient's symptoms seem to be consistent with a left-sided ureteral stone. That explains the hematuria. No evidence urinary tract infection. Will go ahead and treat with Naprosyn. Patient  has regular Dr. followup with.    Vanetta Mulders, MD 09/28/13 1710

## 2014-02-23 ENCOUNTER — Encounter (HOSPITAL_COMMUNITY): Payer: Self-pay | Admitting: Emergency Medicine

## 2014-04-24 NOTE — L&D Delivery Note (Signed)
Patient is 24 y.o. 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  APGAR: 9, 9; weight 6 lb 4.9 oz (2860 g).   Placenta status: Intact, Spontaneous.  Cord: 3 vessels with the following complications: None   Anesthesia: Epidural  Episiotomy: None Lacerations:  Right labial abrasion Suture Repair: 4.0 monocryl Est. Blood Loss (mL):  253  Mom to postpartum.  Baby to Couplet care / Skin to Skin.  Nicole Moon 10/31/2014, 8:20 PM

## 2014-05-13 ENCOUNTER — Ambulatory Visit (INDEPENDENT_AMBULATORY_CARE_PROVIDER_SITE_OTHER): Payer: BLUE CROSS/BLUE SHIELD | Admitting: Adult Health

## 2014-05-13 ENCOUNTER — Encounter: Payer: Self-pay | Admitting: Adult Health

## 2014-05-13 VITALS — BP 120/54 | Ht 65.0 in | Wt 253.5 lb

## 2014-05-13 DIAGNOSIS — Z3490 Encounter for supervision of normal pregnancy, unspecified, unspecified trimester: Secondary | ICD-10-CM

## 2014-05-13 DIAGNOSIS — R110 Nausea: Secondary | ICD-10-CM

## 2014-05-13 DIAGNOSIS — Z3201 Encounter for pregnancy test, result positive: Secondary | ICD-10-CM | POA: Diagnosis not present

## 2014-05-13 HISTORY — DX: Nausea: R11.0

## 2014-05-13 LAB — POCT URINE PREGNANCY: Preg Test, Ur: POSITIVE

## 2014-05-13 MED ORDER — PRENATAL PLUS 27-1 MG PO TABS: 1.0000 | ORAL_TABLET | Freq: Every day | ORAL | Status: DC

## 2014-05-13 MED ORDER — PROMETHAZINE HCL 25 MG PO TABS: 25.0000 mg | ORAL_TABLET | Freq: Four times a day (QID) | ORAL | Status: DC | PRN

## 2014-05-13 NOTE — Patient Instructions (Signed)
First Trimester of Pregnancy The first trimester of pregnancy is from week 1 until the end of week 12 (months 1 through 3). A week after a sperm fertilizes an egg, the egg will implant on the wall of the uterus. This embryo will begin to develop into a baby. Genes from you and your partner are forming the baby. The female genes determine whether the baby is a boy or a girl. At 6-8 weeks, the eyes and face are formed, and the heartbeat can be seen on ultrasound. At the end of 12 weeks, all the baby's organs are formed.  Now that you are pregnant, you will want to do everything you can to have a healthy baby. Two of the most important things are to get good prenatal care and to follow your health care provider's instructions. Prenatal care is all the medical care you receive before the baby's birth. This care will help prevent, find, and treat any problems during the pregnancy and childbirth. BODY CHANGES Your body goes through many changes during pregnancy. The changes vary from woman to woman.   You may gain or lose a couple of pounds at first.  You may feel sick to your stomach (nauseous) and throw up (vomit). If the vomiting is uncontrollable, call your health care provider.  You may tire easily.  You may develop headaches that can be relieved by medicines approved by your health care provider.  You may urinate more often. Painful urination may mean you have a bladder infection.  You may develop heartburn as a result of your pregnancy.  You may develop constipation because certain hormones are causing the muscles that push waste through your intestines to slow down.  You may develop hemorrhoids or swollen, bulging veins (varicose veins).  Your breasts may begin to grow larger and become tender. Your nipples may stick out more, and the tissue that surrounds them (areola) may become darker.  Your gums may bleed and may be sensitive to brushing and flossing.  Dark spots or blotches (chloasma,  mask of pregnancy) may develop on your face. This will likely fade after the baby is born.  Your menstrual periods will stop.  You may have a loss of appetite.  You may develop cravings for certain kinds of food.  You may have changes in your emotions from day to day, such as being excited to be pregnant or being concerned that something may go wrong with the pregnancy and baby.  You may have more vivid and strange dreams.  You may have changes in your hair. These can include thickening of your hair, rapid growth, and changes in texture. Some women also have hair loss during or after pregnancy, or hair that feels dry or thin. Your hair will most likely return to normal after your baby is born. WHAT TO EXPECT AT YOUR PRENATAL VISITS During a routine prenatal visit:  You will be weighed to make sure you and the baby are growing normally.  Your blood pressure will be taken.  Your abdomen will be measured to track your baby's growth.  The fetal heartbeat will be listened to starting around week 10 or 12 of your pregnancy.  Test results from any previous visits will be discussed. Your health care provider may ask you:  How you are feeling.  If you are feeling the baby move.  If you have had any abnormal symptoms, such as leaking fluid, bleeding, severe headaches, or abdominal cramping.  If you have any questions. Other tests   that may be performed during your first trimester include:  Blood tests to find your blood type and to check for the presence of any previous infections. They will also be used to check for low iron levels (anemia) and Rh antibodies. Later in the pregnancy, blood tests for diabetes will be done along with other tests if problems develop.  Urine tests to check for infections, diabetes, or protein in the urine.  An ultrasound to confirm the proper growth and development of the baby.  An amniocentesis to check for possible genetic problems.  Fetal screens for  spina bifida and Down syndrome.  You may need other tests to make sure you and the baby are doing well. HOME CARE INSTRUCTIONS  Medicines  Follow your health care provider's instructions regarding medicine use. Specific medicines may be either safe or unsafe to take during pregnancy.  Take your prenatal vitamins as directed.  If you develop constipation, try taking a stool softener if your health care provider approves. Diet  Eat regular, well-balanced meals. Choose a variety of foods, such as meat or vegetable-based protein, fish, milk and low-fat dairy products, vegetables, fruits, and whole grain breads and cereals. Your health care provider will help you determine the amount of weight gain that is right for you.  Avoid raw meat and uncooked cheese. These carry germs that can cause birth defects in the baby.  Eating four or five small meals rather than three large meals a day may help relieve nausea and vomiting. If you start to feel nauseous, eating a few soda crackers can be helpful. Drinking liquids between meals instead of during meals also seems to help nausea and vomiting.  If you develop constipation, eat more high-fiber foods, such as fresh vegetables or fruit and whole grains. Drink enough fluids to keep your urine clear or pale yellow. Activity and Exercise  Exercise only as directed by your health care provider. Exercising will help you:  Control your weight.  Stay in shape.  Be prepared for labor and delivery.  Experiencing pain or cramping in the lower abdomen or low back is a good sign that you should stop exercising. Check with your health care provider before continuing normal exercises.  Try to avoid standing for long periods of time. Move your legs often if you must stand in one place for a long time.  Avoid heavy lifting.  Wear low-heeled shoes, and practice good posture.  You may continue to have sex unless your health care provider directs you  otherwise. Relief of Pain or Discomfort  Wear a good support bra for breast tenderness.   Take warm sitz baths to soothe any pain or discomfort caused by hemorrhoids. Use hemorrhoid cream if your health care provider approves.   Rest with your legs elevated if you have leg cramps or low back pain.  If you develop varicose veins in your legs, wear support hose. Elevate your feet for 15 minutes, 3-4 times a day. Limit salt in your diet. Prenatal Care  Schedule your prenatal visits by the twelfth week of pregnancy. They are usually scheduled monthly at first, then more often in the last 2 months before delivery.  Write down your questions. Take them to your prenatal visits.  Keep all your prenatal visits as directed by your health care provider. Safety  Wear your seat belt at all times when driving.  Make a list of emergency phone numbers, including numbers for family, friends, the hospital, and police and fire departments. General Tips    Ask your health care provider for a referral to a local prenatal education class. Begin classes no later than at the beginning of month 6 of your pregnancy.  Ask for help if you have counseling or nutritional needs during pregnancy. Your health care provider can offer advice or refer you to specialists for help with various needs.  Do not use hot tubs, steam rooms, or saunas.  Do not douche or use tampons or scented sanitary pads.  Do not cross your legs for long periods of time.  Avoid cat litter boxes and soil used by cats. These carry germs that can cause birth defects in the baby and possibly loss of the fetus by miscarriage or stillbirth.  Avoid all smoking, herbs, alcohol, and medicines not prescribed by your health care provider. Chemicals in these affect the formation and growth of the baby.  Schedule a dentist appointment. At home, brush your teeth with a soft toothbrush and be gentle when you floss. SEEK MEDICAL CARE IF:   You have  dizziness.  You have mild pelvic cramps, pelvic pressure, or nagging pain in the abdominal area.  You have persistent nausea, vomiting, or diarrhea.  You have a bad smelling vaginal discharge.  You have pain with urination.  You notice increased swelling in your face, hands, legs, or ankles. SEEK IMMEDIATE MEDICAL CARE IF:   You have a fever.  You are leaking fluid from your vagina.  You have spotting or bleeding from your vagina.  You have severe abdominal cramping or pain.  You have rapid weight gain or loss.  You vomit blood or material that looks like coffee grounds.  You are exposed to German measles and have never had them.  You are exposed to fifth disease or chickenpox.  You develop a severe headache.  You have shortness of breath.  You have any kind of trauma, such as from a fall or a car accident. Document Released: 04/04/2001 Document Revised: 08/25/2013 Document Reviewed: 02/18/2013 [employer] Patient Information 2015 [employer]. This information is not intended to replace advice given to you by your health care provider. Make sure you discuss any questions you have with your health care provider. Return in 1 week for dating [place] 

## 2014-05-13 NOTE — Progress Notes (Signed)
Subjective:     Patient ID: Nicole Moon, female   DOB: [birth year], 24 y.o.   MRN: [id number]  HPI Nicole Moon is a 24 year old white female in for UPT and complains of nausea, can't eat.Last baby born in 2014.Had gestational diabetes.  Review of Systems See HPI Reviewed past medical,surgical, social and family history. Reviewed medications and allergies.     Objective:   Physical Exam BP 120/54 mmHg  Ht 5\' 5"  (1.651 m)  Wt 253 lb 8 oz (114.987 kg)  BMI 42.18 kg/m2  LMP 03/14/2014  Breastfeeding? No   UPT +, about 8+4 weeks by LMP with EDD 01/21/15, medicaid form given.  Assessment:     Pregnant +UPT Nausea     Plan:    Will get early GTT but will get US first Rx prenatal plus #30 1 daily with 11 refills Rx phenergan 25 mg #30 1 every 6 hours prn N/V with 1 refill Return in 1 week for dating US Review handout on first trimester Eat more often

## 2014-05-20 ENCOUNTER — Other Ambulatory Visit: Payer: Self-pay | Admitting: Adult Health

## 2014-05-20 ENCOUNTER — Other Ambulatory Visit: Payer: Self-pay

## 2014-05-20 ENCOUNTER — Other Ambulatory Visit: Payer: Self-pay | Admitting: Obstetrics & Gynecology

## 2014-05-20 ENCOUNTER — Encounter: Payer: Self-pay | Admitting: Adult Health

## 2014-05-20 ENCOUNTER — Ambulatory Visit (INDEPENDENT_AMBULATORY_CARE_PROVIDER_SITE_OTHER): Payer: BLUE CROSS/BLUE SHIELD

## 2014-05-20 DIAGNOSIS — O26841 Uterine size-date discrepancy, first trimester: Secondary | ICD-10-CM | POA: Diagnosis not present

## 2014-05-20 DIAGNOSIS — Z3682 Encounter for antenatal screening for nuchal translucency: Secondary | ICD-10-CM

## 2014-05-20 DIAGNOSIS — Z3481 Encounter for supervision of other normal pregnancy, first trimester: Secondary | ICD-10-CM

## 2014-05-20 DIAGNOSIS — Z36 Encounter for antenatal screening of mother: Secondary | ICD-10-CM

## 2014-05-20 DIAGNOSIS — O3680X0 Pregnancy with inconclusive fetal viability, not applicable or unspecified: Secondary | ICD-10-CM

## 2014-05-20 DIAGNOSIS — Z3490 Encounter for supervision of normal pregnancy, unspecified, unspecified trimester: Secondary | ICD-10-CM

## 2014-05-20 NOTE — Progress Notes (Signed)
U/S-single IUP with +FCA noted, FHR-159 bpm, CRL c/w 13+2wks EDD 11/23/2014, cx appears closed, bilateral adnexa appears WNL, pt desires NT/It screen, NT-1.1272mm, anterior Gr 0 placenta

## 2014-05-27 LAB — MATERNAL SCREEN, INTEGRATED #1
CROWN RUMP LENGTH MAT SCREEN: 72.5 mm
Gest. Age on Collection Date: 13.1 weeks
MATERNAL AGE AT EDD: 23.5 a
Nuchal Translucency (NT): 1.7 mm
Number of Fetuses: 1
PAPP-A VALUE: 356.2 ng/mL
Weight: 257 [lb_av]

## 2014-05-27 LAB — US OB COMP LESS 14 WKS

## 2014-05-28 ENCOUNTER — Other Ambulatory Visit (HOSPITAL_COMMUNITY)
Admission: RE | Admit: 2014-05-28 | Discharge: 2014-05-28 | Disposition: A | Discharge status: Home / Self Care | Payer: BLUE CROSS/BLUE SHIELD | Source: Ambulatory Visit | Attending: Advanced Practice Midwife | Admitting: Advanced Practice Midwife

## 2014-05-28 ENCOUNTER — Ambulatory Visit (INDEPENDENT_AMBULATORY_CARE_PROVIDER_SITE_OTHER): Payer: Self-pay | Admitting: Advanced Practice Midwife

## 2014-05-28 ENCOUNTER — Encounter: Payer: Self-pay | Admitting: Advanced Practice Midwife

## 2014-05-28 VITALS — BP 102/64 | Wt 255.0 lb

## 2014-05-28 DIAGNOSIS — Z331 Pregnant state, incidental: Secondary | ICD-10-CM

## 2014-05-28 DIAGNOSIS — Z113 Encounter for screening for infections with a predominantly sexual mode of transmission: Secondary | ICD-10-CM

## 2014-05-28 DIAGNOSIS — Z131 Encounter for screening for diabetes mellitus: Secondary | ICD-10-CM

## 2014-05-28 DIAGNOSIS — Z0184 Encounter for antibody response examination: Secondary | ICD-10-CM

## 2014-05-28 DIAGNOSIS — Z1371 Encounter for nonprocreative screening for genetic disease carrier status: Secondary | ICD-10-CM

## 2014-05-28 DIAGNOSIS — Z1389 Encounter for screening for other disorder: Secondary | ICD-10-CM

## 2014-05-28 DIAGNOSIS — Z3482 Encounter for supervision of other normal pregnancy, second trimester: Secondary | ICD-10-CM

## 2014-05-28 DIAGNOSIS — Z124 Encounter for screening for malignant neoplasm of cervix: Secondary | ICD-10-CM

## 2014-05-28 DIAGNOSIS — Z114 Encounter for screening for human immunodeficiency virus [HIV]: Secondary | ICD-10-CM

## 2014-05-28 DIAGNOSIS — Z01419 Encounter for gynecological examination (general) (routine) without abnormal findings: Secondary | ICD-10-CM | POA: Diagnosis present

## 2014-05-28 DIAGNOSIS — Z130 Encounter for screening for diseases of the blood and blood-forming organs and certain disorders involving the immune mechanism: Secondary | ICD-10-CM

## 2014-05-28 DIAGNOSIS — Z3490 Encounter for supervision of normal pregnancy, unspecified, unspecified trimester: Secondary | ICD-10-CM

## 2014-05-28 DIAGNOSIS — Z0283 Encounter for blood-alcohol and blood-drug test: Secondary | ICD-10-CM

## 2014-05-28 LAB — POCT URINALYSIS DIPSTICK
Glucose, UA: NEGATIVE
KETONES UA: NEGATIVE
Leukocytes, UA: NEGATIVE
Nitrite, UA: NEGATIVE
Protein, UA: NEGATIVE

## 2014-05-28 LAB — OB RESULTS CONSOLE RUBELLA ANTIBODY, IGM: Rubella: UNDETERMINED

## 2014-05-28 LAB — OB RESULTS CONSOLE ABO/RH: RH Type: NEGATIVE

## 2014-05-28 NOTE — Progress Notes (Signed)
  Subjective:    Nicole Moon is a [id number] 4665w3d being seen today for her first obstetrical visit.  Her obstetrical history is significant for atypical HELLP  (elevated LE only) with IOL at 36.4 weeks and GDM last pregnancy.  Pregnancy history fully reviewed.  Patient reports no complaints.  Filed Vitals:   05/28/14 0929  BP: 102/64  Weight: 255 lb (115.667 kg)    HISTORY: OB History  Gravida Para Term Preterm AB SAB TAB Ectopic Multiple Living  3 2 1 1  0 0 0 0 0 2    # Outcome Date GA Lbr Len/2nd Weight Sex Delivery Anes PTL Lv  3 Current           2 Term 03/20/13 3055w6d 441:30 / 00:21 8 lb 0.6 oz (3.646 kg) M Vag-Spont EPI  Y  1 Preterm 11/29/11 3940w4d 13:55 / 00:57 5 lb 9 oz (2.523 kg) F Vag-Spont EPI  Y     Comments: caput, HELLP     Past Medical History  Diagnosis Date  . Asthma   . Gestational diabetes   . History of HELLP syndrome, currently pregnant   . Pregnant 05/13/2014  . Nausea 05/13/2014   Past Surgical History  Procedure Laterality Date  . Ankle surgery    . Fracture surgery     Family History  Problem Relation Age of Onset  . Heart disease Mother   . Heart disease Father   . Cancer Paternal Grandmother     cervical  . Heart attack Paternal Grandfather      Exam       Pelvic Exam:    Perineum: Normal Perineum   Vulva: normal   Vagina:  normal mucosa, normal discharge, no palpable nodules   Uterus Normal, Gravid,      Cervix: Normal.  Pap collected   Adnexa: Not palpable   Urinary:  urethral meatus normal    System: Breast:  normal appearance, no masses or tenderness   Skin: normal coloration and turgor, no rashes    Neurologic: oriented, normal, normal mood   Extremities: normal strength, tone, and muscle mass   HEENT PERRLA   Mouth/Teeth mucous membranes moist, normal dentition   Neck supple and no masses   Cardiovascular: regular rate and rhythm   Respiratory:  appears well, vitals normal, no respiratory distress, acyanotic   Abdomen: soft, non-tender;  FHR: 160          Assessment:    Pregnancy: [id number] Patient Active Problem List   Diagnosis Date Noted  . Pregnant 05/13/2014  . Nausea 05/13/2014  . Headache 10/04/2012  . History of HELLP syndrome, currently pregnant 10/04/2012        Plan:     Initial labs drawn. Continue prenatal vitamins  Problem list reviewed and updated  Reviewed n/v relief measures and warning s/s to report  Reviewed recommended weight gain based on pre-gravid BMI  Encouraged well-balanced diet Genetic Screening discussed Integrated Screen: requested.  Ultrasound discussed; fetal survey: requested. F/U tomorrow with [facility] for 1hr Gtt (hx GDM, morbid obesity)  Follow up in 3 weeks for OBV and 2nd IT.  CRESENZO-DISHMAN,Alvie Fowles 05/28/2014  '

## 2014-05-30 LAB — URINE CULTURE: ORGANISM ID, BACTERIA: NO GROWTH

## 2014-06-02 LAB — CBC
HEMATOCRIT: 34.5 % (ref 34.0–46.6)
Hemoglobin: 11.7 g/dL (ref 11.1–15.9)
MCH: 29 pg (ref 26.6–33.0)
MCHC: 33.9 g/dL (ref 31.5–35.7)
MCV: 86 fL (ref 79–97)
Platelets: 162 10*3/uL (ref 150–379)
RBC: 4.03 x10E6/uL (ref 3.77–5.28)
RDW: 14 % (ref 12.3–15.4)
WBC: 9.8 10*3/uL (ref 3.4–10.8)

## 2014-06-02 LAB — URINALYSIS, ROUTINE W REFLEX MICROSCOPIC
BILIRUBIN UA: NEGATIVE
GLUCOSE, UA: NEGATIVE
Ketones, UA: NEGATIVE
Leukocytes, UA: NEGATIVE
Nitrite, UA: NEGATIVE
Protein, UA: NEGATIVE
RBC, UA: NEGATIVE
SPEC GRAV UA: 1.021 (ref 1.005–1.030)
Urobilinogen, Ur: 0.2 mg/dL (ref 0.2–1.0)
pH, UA: 6.5 (ref 5.0–7.5)

## 2014-06-02 LAB — PMP SCREEN PROFILE (10S), URINE
AMPHETAMINE SCRN UR: NEGATIVE ng/mL
BARBITURATE SCRN UR: NEGATIVE ng/mL
Benzodiazepine Screen, Urine: NEGATIVE ng/mL
CREATININE(CRT), U: 183.5 mg/dL (ref 20.0–300.0)
Cannabinoids Ur Ql Scn: NEGATIVE ng/mL
Cocaine(Metab.)Screen, Urine: NEGATIVE ng/mL
Methadone Scn, Ur: NEGATIVE ng/mL
Opiate Scrn, Ur: NEGATIVE ng/mL
Oxycodone+Oxymorphone Ur Ql Scn: NEGATIVE ng/mL
PCP Scrn, Ur: NEGATIVE ng/mL
Ph of Urine: 6.4 (ref 4.5–8.9)
Propoxyphene, Screen: NEGATIVE ng/mL

## 2014-06-02 LAB — RUBELLA SCREEN

## 2014-06-02 LAB — RPR: RPR: NONREACTIVE

## 2014-06-02 LAB — SICKLE CELL SCREEN: Sickle Cell Screen: NEGATIVE

## 2014-06-02 LAB — ABO/RH: Rh Factor: NEGATIVE

## 2014-06-02 LAB — VARICELLA ZOSTER ANTIBODY, IGG

## 2014-06-02 LAB — ANTIBODY SCREEN: Antibody Screen: NEGATIVE

## 2014-06-02 LAB — CYSTIC FIBROSIS MUTATION 97: GENE DIS ANAL CARRIER INTERP BLD/T-IMP: NOT DETECTED

## 2014-06-02 LAB — CYTOLOGY - PAP

## 2014-06-02 LAB — HEPATITIS B SURFACE ANTIGEN: Hepatitis B Surface Ag: NEGATIVE

## 2014-06-02 LAB — HIV ANTIBODY (ROUTINE TESTING W REFLEX): HIV Screen 4th Generation wRfx: NONREACTIVE

## 2014-06-18 ENCOUNTER — Encounter: Payer: Self-pay | Admitting: Advanced Practice Midwife

## 2014-06-18 ENCOUNTER — Encounter: Payer: Self-pay | Admitting: *Deleted

## 2014-07-13 ENCOUNTER — Emergency Department (HOSPITAL_COMMUNITY)
Admission: EM | Admit: 2014-07-13 | Discharge: 2014-07-13 | Disposition: A | Discharge status: Home / Self Care | Payer: Medicaid Other | Attending: Emergency Medicine | Admitting: Emergency Medicine

## 2014-07-13 ENCOUNTER — Encounter (HOSPITAL_COMMUNITY): Payer: Self-pay | Admitting: Emergency Medicine

## 2014-07-13 DIAGNOSIS — O210 Mild hyperemesis gravidarum: Secondary | ICD-10-CM | POA: Diagnosis not present

## 2014-07-13 DIAGNOSIS — O99519 Diseases of the respiratory system complicating pregnancy, unspecified trimester: Secondary | ICD-10-CM | POA: Diagnosis not present

## 2014-07-13 DIAGNOSIS — Z87891 Personal history of nicotine dependence: Secondary | ICD-10-CM | POA: Insufficient documentation

## 2014-07-13 DIAGNOSIS — Z3A00 Weeks of gestation of pregnancy not specified: Secondary | ICD-10-CM | POA: Insufficient documentation

## 2014-07-13 DIAGNOSIS — J45909 Unspecified asthma, uncomplicated: Secondary | ICD-10-CM | POA: Diagnosis not present

## 2014-07-13 DIAGNOSIS — Z79899 Other long term (current) drug therapy: Secondary | ICD-10-CM | POA: Diagnosis not present

## 2014-07-13 DIAGNOSIS — O26899 Other specified pregnancy related conditions, unspecified trimester: Secondary | ICD-10-CM

## 2014-07-13 DIAGNOSIS — R109 Unspecified abdominal pain: Secondary | ICD-10-CM | POA: Diagnosis not present

## 2014-07-13 DIAGNOSIS — O9989 Other specified diseases and conditions complicating pregnancy, childbirth and the puerperium: Secondary | ICD-10-CM | POA: Diagnosis not present

## 2014-07-13 DIAGNOSIS — R112 Nausea with vomiting, unspecified: Secondary | ICD-10-CM

## 2014-07-13 LAB — COMPREHENSIVE METABOLIC PANEL
ALT: 22 U/L (ref 0–35)
AST: 37 U/L (ref 0–37)
Albumin: 3.1 g/dL — ABNORMAL LOW (ref 3.5–5.2)
Alkaline Phosphatase: 68 U/L (ref 39–117)
Anion gap: 7 (ref 5–15)
BUN: 7 mg/dL (ref 6–23)
CO2: 22 mmol/L (ref 19–32)
Calcium: 8.5 mg/dL (ref 8.4–10.5)
Chloride: 105 mmol/L (ref 96–112)
Creatinine, Ser: 0.69 mg/dL (ref 0.50–1.10)
GFR calc Af Amer: >90 mL/min (ref >90)
GFR calc non Af Amer: >90 mL/min (ref >90)
Glucose, Bld: 116 mg/dL — ABNORMAL HIGH (ref 70–99)
Potassium: 3.6 mmol/L (ref 3.5–5.1)
Sodium: 134 mmol/L — ABNORMAL LOW (ref 135–145)
Total Bilirubin: 0.5 mg/dL (ref 0.3–1.2)
Total Protein: 6.4 g/dL (ref 6.0–8.3)

## 2014-07-13 LAB — CBC WITH DIFFERENTIAL/PLATELET
Basophils Absolute: 0 10*3/uL (ref 0.0–0.1)
Basophils Relative: 0 % (ref 0–1)
Eosinophils Absolute: 0 10*3/uL (ref 0.0–0.7)
Eosinophils Relative: 0 % (ref 0–5)
HCT: 33.4 % — ABNORMAL LOW (ref 36.0–46.0)
Hemoglobin: 10.9 g/dL — ABNORMAL LOW (ref 12.0–15.0)
Lymphocytes Relative: 14 % (ref 12–46)
Lymphs Abs: 0.9 10*3/uL (ref 0.7–4.0)
MCH: 28.6 pg (ref 26.0–34.0)
MCHC: 32.6 g/dL (ref 30.0–36.0)
MCV: 87.7 fL (ref 78.0–100.0)
Monocytes Absolute: 0.5 10*3/uL (ref 0.1–1.0)
Monocytes Relative: 7 % (ref 3–12)
Neutro Abs: 5 10*3/uL (ref 1.7–7.7)
Neutrophils Relative %: 79 % — ABNORMAL HIGH (ref 43–77)
Platelets: 154 10*3/uL (ref 150–400)
RBC: 3.81 MIL/uL — ABNORMAL LOW (ref 3.87–5.11)
RDW: 14.1 % (ref 11.5–15.5)
WBC: 6.4 10*3/uL (ref 4.0–10.5)

## 2014-07-13 LAB — URINALYSIS, ROUTINE W REFLEX MICROSCOPIC
Bilirubin Urine: NEGATIVE
Glucose, UA: NEGATIVE mg/dL
Hgb urine dipstick: NEGATIVE
Ketones, ur: 40 mg/dL — AB
Leukocytes, UA: NEGATIVE
Nitrite: NEGATIVE
Protein, ur: NEGATIVE mg/dL
Specific Gravity, Urine: 1.025 (ref 1.005–1.030)
Urobilinogen, UA: 2 mg/dL — ABNORMAL HIGH (ref 0.0–1.0)
pH: 6 (ref 5.0–8.0)

## 2014-07-13 MED ORDER — ONDANSETRON HCL 4 MG PO TABS: 4.0000 mg | ORAL_TABLET | Freq: Four times a day (QID) | ORAL | Status: DC

## 2014-07-13 MED ORDER — SODIUM CHLORIDE 0.9 % IV BOLUS (SEPSIS)
1000.0000 mL | Freq: Once | INTRAVENOUS | Status: AC
  Administered 2014-07-13: 1000 mL via INTRAVENOUS

## 2014-07-13 MED ORDER — ONDANSETRON HCL 4 MG/2ML IJ SOLN
4.0000 mg | Freq: Once | INTRAMUSCULAR | Status: AC
  Administered 2014-07-13: 4 mg via INTRAMUSCULAR
  Filled 2014-07-13: qty 2

## 2014-07-13 NOTE — ED Notes (Signed)
Pt reports intermittent abdominal pain since vomiting last night. Pt reports loss of appetite since last night. nad noted. Pt denies any vaginal discharge or vaginal bleeding.

## 2014-07-14 NOTE — ED Provider Notes (Signed)
CSN: [id number]     Arrival date & time 07/13/14  1408 History   First MD Initiated Contact with Patient 07/13/14 1847     Chief Complaint  Patient presents with  . Abdominal Pain     (Consider location/radiation/quality/duration/timing/severity/associated sxs/prior Treatment) HPI   24 year old female with abdominal pain. Gradual onset last night. Intermittent. Pain is diffuse. Worse the upper abdomen. Associated with anorexia. Nausea and vomiting. No diarrhea. No urinary complaints. No unusual vaginal bleeding or discharge. No intervention prior to arrival. No sick contacts. Patient is pregnant.  Past Medical History  Diagnosis Date  . Asthma   . Gestational diabetes   . History of HELLP syndrome, currently pregnant   . Pregnant 05/13/2014  . Nausea 05/13/2014   Past Surgical History  Procedure Laterality Date  . Ankle surgery    . Fracture surgery     Family History  Problem Relation Age of Onset  . Heart disease Mother   . Heart disease Father   . Cancer Paternal Grandmother     cervical  . Heart attack Paternal Grandfather    History  Substance Use Topics  . Smoking status: Former Games developermoker  . Smokeless tobacco: Never Used  . Alcohol Use: No   OB History    Gravida Para Term Preterm AB TAB SAB Ectopic Multiple Living   3 2 1 1  0 0 0 0 0 2     Review of Systems  All systems reviewed and negative, other than as noted in HPI.   Allergies  Review of patient's allergies indicates no known allergies.  Home Medications   Prior to Admission medications   Medication Sig Start Date End Date Taking? Authorizing Provider  acetaminophen (TYLENOL) 325 MG tablet Take 650 mg by mouth every 6 (six) hours as needed for mild pain or moderate pain (pain).     Historical Provider, MD  ALBUTEROL IN Inhale into the lungs 2 (two) times daily.    Historical Provider, MD  ondansetron (ZOFRAN) 4 MG tablet Take 1 tablet (4 mg total) by mouth every 6 (six) hours. 07/13/14   Raeford RazorStephen Tresean Mattix,  MD  prenatal vitamin w/FE, FA (PRENATAL 1 + 1) 27-1 MG TABS tablet Take 1 tablet by mouth daily at 12 noon. 05/13/14   Adline PotterJennifer A Griffin, NP  promethazine (PHENERGAN) 25 MG tablet Take 1 tablet (25 mg total) by mouth every 6 (six) hours as needed for nausea or vomiting. 05/13/14   Adline PotterJennifer A Griffin, NP   BP 104/61 mmHg  Pulse 99  Temp(Src) 98.7 F (37.1 C) (Oral)  Resp 20  Ht 5\' 6"  (1.676 m)  Wt 255 lb (115.667 kg)  BMI 41.18 kg/m2  SpO2 100%  LMP 03/14/2014 Physical Exam  Constitutional: She appears well-developed and well-nourished. No distress.  HENT:  Head: Normocephalic and atraumatic.  Eyes: Conjunctivae are normal. Right eye exhibits no discharge. Left eye exhibits no discharge.  Neck: Neck supple.  Cardiovascular: Normal rate, regular rhythm and normal heart sounds.  Exam reveals no gallop and no friction rub.   No murmur heard. Pulmonary/Chest: Effort normal and breath sounds normal. No respiratory distress.  Abdominal: Soft. She exhibits no distension. There is no tenderness.  Genitourinary:  No CVA tenderness  Musculoskeletal: She exhibits no edema or tenderness.  Neurological: She is alert.  Skin: Skin is warm and dry.  Psychiatric: She has a normal mood and affect. Her behavior is normal. Thought content normal.  Nursing note and vitals reviewed.   ED Course  Procedures (  including critical care time) Labs Review Labs Reviewed  CBC WITH DIFFERENTIAL/PLATELET - Abnormal; Notable for the following:    RBC 3.81 (*)    Hemoglobin 10.9 (*)    HCT 33.4 (*)    Neutrophils Relative % 79 (*)    All other components within normal limits  COMPREHENSIVE METABOLIC PANEL - Abnormal; Notable for the following:    Sodium 134 (*)    Glucose, Bld 116 (*)    Albumin 3.1 (*)    All other components within normal limits  URINALYSIS, ROUTINE W REFLEX MICROSCOPIC - Abnormal; Notable for the following:    Ketones, ur 40 (*)    Urobilinogen, UA 2.0 (*)    All other components  within normal limits    Imaging Review No results found.   EKG Interpretation None      MDM   Final diagnoses:  Non-intractable vomiting with nausea, vomiting of unspecified type  Abdominal pain during pregnancy     24 year old female with abdominal pain and pregnancy. Benign exam. Afebrile. Appears well. Presenting unremarkable workup aside from some ketonuria. Tolerating fluids the emergency room. I feel stable for discharge at this time.    Raeford Razor, MD 07/17/14 [phone]

## 2014-08-04 ENCOUNTER — Encounter: Payer: Self-pay | Admitting: Women's Health

## 2014-08-04 ENCOUNTER — Ambulatory Visit (INDEPENDENT_AMBULATORY_CARE_PROVIDER_SITE_OTHER): Payer: Self-pay | Admitting: Women's Health

## 2014-08-04 VITALS — BP 126/56 | HR 96 | Wt 253.0 lb

## 2014-08-04 DIAGNOSIS — O09899 Supervision of other high risk pregnancies, unspecified trimester: Secondary | ICD-10-CM

## 2014-08-04 DIAGNOSIS — O360121 Maternal care for anti-D [Rh] antibodies, second trimester, fetus 1: Secondary | ICD-10-CM

## 2014-08-04 DIAGNOSIS — Z6791 Unspecified blood type, Rh negative: Secondary | ICD-10-CM | POA: Insufficient documentation

## 2014-08-04 DIAGNOSIS — Z1389 Encounter for screening for other disorder: Secondary | ICD-10-CM

## 2014-08-04 DIAGNOSIS — Z331 Pregnant state, incidental: Secondary | ICD-10-CM

## 2014-08-04 DIAGNOSIS — O09299 Supervision of pregnancy with other poor reproductive or obstetric history, unspecified trimester: Secondary | ICD-10-CM | POA: Insufficient documentation

## 2014-08-04 DIAGNOSIS — O26899 Other specified pregnancy related conditions, unspecified trimester: Secondary | ICD-10-CM

## 2014-08-04 DIAGNOSIS — O9989 Other specified diseases and conditions complicating pregnancy, childbirth and the puerperium: Secondary | ICD-10-CM

## 2014-08-04 DIAGNOSIS — O09292 Supervision of pregnancy with other poor reproductive or obstetric history, second trimester: Secondary | ICD-10-CM

## 2014-08-04 DIAGNOSIS — Z283 Underimmunization status: Secondary | ICD-10-CM | POA: Insufficient documentation

## 2014-08-04 DIAGNOSIS — Z3492 Encounter for supervision of normal pregnancy, unspecified, second trimester: Secondary | ICD-10-CM

## 2014-08-04 DIAGNOSIS — Z2839 Other underimmunization status: Secondary | ICD-10-CM | POA: Insufficient documentation

## 2014-08-04 DIAGNOSIS — Z8632 Personal history of gestational diabetes: Secondary | ICD-10-CM

## 2014-08-04 LAB — POCT URINALYSIS DIPSTICK
Blood, UA: NEGATIVE
GLUCOSE UA: NEGATIVE
Ketones, UA: NEGATIVE
Leukocytes, UA: NEGATIVE
Nitrite, UA: NEGATIVE
Protein, UA: NEGATIVE

## 2014-08-04 NOTE — Progress Notes (Signed)
Low-risk OB appointment [id number] 5616w1d Estimated Date of Delivery: 11/23/14 BP 126/56 mmHg  Pulse 96  Wt 253 lb (114.76 kg)  LMP 03/14/2014  BP, weight, and urine reviewed.  Refer to obstetrical flow sheet for FH & FHR.  Reports good fm.  Denies regular uc's, lof, vb, or uti s/s. No complaints. No care since 14wks d/t [facility] being denied d/t not having a piece of paperwork. Missed early gtt, 2nd IT & anatomy u/s. Offered AFP- declines.  Reviewed ptl s/s, fm. Plan:  Continue routine obstetrical care  F/U asap for anatomy u/s (no visit), then 4wks for OB appointment and pn2

## 2014-08-04 NOTE — Patient Instructions (Signed)
You will have your sugar test next visit.  Please do not eat or drink anything after midnight the night before you come, not even water.  You will be here for at least two hours.     Call the office ([phone]) or go to [facility] if:  You begin to have strong, frequent contractions  Your water breaks.  Sometimes it is a big gush of fluid, sometimes it is just a trickle that keeps getting your panties wet or running down your legs  You have vaginal bleeding.  It is normal to have a small amount of spotting if your cervix was checked.   You don't feel your baby moving like normal.  If you don't, get you something to eat and drink and lay down and focus on feeling your baby move.   If your baby is still not moving like normal, you should call the office or go to [facility].  Second Trimester of Pregnancy The second trimester is from week 13 through week 28, months 4 through 6. The second trimester is often a time when you feel your best. Your body has also adjusted to being pregnant, and you begin to feel better physically. Usually, morning sickness has lessened or quit completely, you may have more energy, and you may have an increase in appetite. The second trimester is also a time when the fetus is growing rapidly. At the end of the sixth month, the fetus is about 9 inches long and weighs about 1 pounds. You will likely begin to feel the baby move (quickening) between 18 and 20 weeks of the pregnancy. BODY CHANGES Your body goes through many changes during pregnancy. The changes vary from woman to woman.   Your weight will continue to increase. You will notice your lower abdomen bulging out.  You may begin to get stretch marks on your hips, abdomen, and breasts.  You may develop headaches that can be relieved by medicines approved by your health care provider.  You may urinate more often because the fetus is pressing on your bladder.  You may develop or continue to have  heartburn as a result of your pregnancy.  You may develop constipation because certain hormones are causing the muscles that push waste through your intestines to slow down.  You may develop hemorrhoids or swollen, bulging veins (varicose veins).  You may have back pain because of the weight gain and pregnancy hormones relaxing your joints between the bones in your pelvis and as a result of a shift in weight and the muscles that support your balance.  Your breasts will continue to grow and be tender.  Your gums may bleed and may be sensitive to brushing and flossing.  Dark spots or blotches (chloasma, mask of pregnancy) may develop on your face. This will likely fade after the baby is born.  A dark line from your belly button to the pubic area (linea nigra) may appear. This will likely fade after the baby is born.  You may have changes in your hair. These can include thickening of your hair, rapid growth, and changes in texture. Some women also have hair loss during or after pregnancy, or hair that feels dry or thin. Your hair will most likely return to normal after your baby is born. WHAT TO EXPECT AT YOUR PRENATAL VISITS During a routine prenatal visit:  You will be weighed to make sure you and the fetus are growing normally.  Your blood pressure will be taken.    Your abdomen will be measured to track your baby's growth.  The fetal heartbeat will be listened to.  Any test results from the previous visit will be discussed. Your health care provider may ask you:  How you are feeling.  If you are feeling the baby move.  If you have had any abnormal symptoms, such as leaking fluid, bleeding, severe headaches, or abdominal cramping.  If you have any questions. Other tests that may be performed during your second trimester include:  Blood tests that check for:  Low iron levels (anemia).  Gestational diabetes (between 24 and 28 weeks).  Rh antibodies.  Urine tests to check  for infections, diabetes, or protein in the urine.  An ultrasound to confirm the proper growth and development of the baby.  An amniocentesis to check for possible genetic problems.  Fetal screens for spina bifida and Down syndrome. HOME CARE INSTRUCTIONS   Avoid all smoking, herbs, alcohol, and unprescribed drugs. These chemicals affect the formation and growth of the baby.  Follow your health care provider's instructions regarding medicine use. There are medicines that are either safe or unsafe to take during pregnancy.  Exercise only as directed by your health care provider. Experiencing uterine cramps is a good sign to stop exercising.  Continue to eat regular, healthy meals.  Wear a good support bra for breast tenderness.  Do not use hot tubs, steam rooms, or saunas.  Wear your seat belt at all times when driving.  Avoid raw meat, uncooked cheese, cat litter boxes, and soil used by cats. These carry germs that can cause birth defects in the baby.  Take your prenatal vitamins.  Try taking a stool softener (if your health care provider approves) if you develop constipation. Eat more high-fiber foods, such as fresh vegetables or fruit and whole grains. Drink plenty of fluids to keep your urine clear or pale yellow.  Take warm sitz baths to soothe any pain or discomfort caused by hemorrhoids. Use hemorrhoid cream if your health care provider approves.  If you develop varicose veins, wear support hose. Elevate your feet for 15 minutes, 3-4 times a day. Limit salt in your diet.  Avoid heavy lifting, wear low heel shoes, and practice good posture.  Rest with your legs elevated if you have leg cramps or low back pain.  Visit your dentist if you have not gone yet during your pregnancy. Use a soft toothbrush to brush your teeth and be gentle when you floss.  A sexual relationship may be continued unless your health care provider directs you otherwise.  Continue to go to all your  prenatal visits as directed by your health care provider. SEEK MEDICAL CARE IF:   You have dizziness.  You have mild pelvic cramps, pelvic pressure, or nagging pain in the abdominal area.  You have persistent nausea, vomiting, or diarrhea.  You have a bad smelling vaginal discharge.  You have pain with urination. SEEK IMMEDIATE MEDICAL CARE IF:   You have a fever.  You are leaking fluid from your vagina.  You have spotting or bleeding from your vagina.  You have severe abdominal cramping or pain.  You have rapid weight gain or loss.  You have shortness of breath with chest pain.  You notice sudden or extreme swelling of your face, hands, ankles, feet, or legs.  You have not felt your baby move in over an hour.  You have severe headaches that do not go away with medicine.  You have vision changes.   Document Released: 04/04/2001 Document Revised: 04/15/2013 Document Reviewed: 06/11/2012 [employer] Patient Information 2015 [employer]. This information is not intended to replace advice given to you by your health care provider. Make sure you discuss any questions you have with your health care provider.     

## 2014-08-10 ENCOUNTER — Other Ambulatory Visit: Payer: Self-pay | Admitting: Women's Health

## 2014-08-10 DIAGNOSIS — Z363 Encounter for antenatal screening for malformations: Secondary | ICD-10-CM

## 2014-08-11 ENCOUNTER — Ambulatory Visit (INDEPENDENT_AMBULATORY_CARE_PROVIDER_SITE_OTHER): Payer: BLUE CROSS/BLUE SHIELD

## 2014-08-11 DIAGNOSIS — Z363 Encounter for antenatal screening for malformations: Secondary | ICD-10-CM

## 2014-08-11 DIAGNOSIS — Z36 Encounter for antenatal screening of mother: Secondary | ICD-10-CM | POA: Diagnosis not present

## 2014-08-11 NOTE — Progress Notes (Signed)
US 6264w1d measurement c/w dates,ant pl grade 0,cephalic,sdp of fluid 5cm,cx 2.6cm,normal bilat ov's,anatomy scan complete,no obvious abnormalities seen

## 2014-09-02 ENCOUNTER — Other Ambulatory Visit: Payer: BLUE CROSS/BLUE SHIELD

## 2014-09-02 ENCOUNTER — Encounter: Payer: Self-pay | Admitting: Advanced Practice Midwife

## 2014-09-02 ENCOUNTER — Ambulatory Visit (INDEPENDENT_AMBULATORY_CARE_PROVIDER_SITE_OTHER): Payer: BLUE CROSS/BLUE SHIELD | Admitting: Advanced Practice Midwife

## 2014-09-02 VITALS — BP 118/58 | HR 64 | Wt 252.0 lb

## 2014-09-02 DIAGNOSIS — Z369 Encounter for antenatal screening, unspecified: Secondary | ICD-10-CM

## 2014-09-02 DIAGNOSIS — Z3493 Encounter for supervision of normal pregnancy, unspecified, third trimester: Secondary | ICD-10-CM

## 2014-09-02 DIAGNOSIS — Z131 Encounter for screening for diabetes mellitus: Secondary | ICD-10-CM

## 2014-09-02 DIAGNOSIS — Z331 Pregnant state, incidental: Secondary | ICD-10-CM

## 2014-09-02 DIAGNOSIS — Z1389 Encounter for screening for other disorder: Secondary | ICD-10-CM

## 2014-09-02 LAB — POCT URINALYSIS DIPSTICK
GLUCOSE UA: NEGATIVE
Ketones, UA: NEGATIVE
NITRITE UA: NEGATIVE
Protein, UA: NEGATIVE

## 2014-09-02 LAB — OB RESULTS CONSOLE HIV ANTIBODY (ROUTINE TESTING): HIV: NONREACTIVE

## 2014-09-02 LAB — OB RESULTS CONSOLE RPR: RPR: NONREACTIVE

## 2014-09-02 NOTE — Progress Notes (Signed)
[id number] 10456w2d Estimated Date of Delivery: 11/23/14  Last menstrual period 03/14/2014, not currently breastfeeding.   BP weight and urine results all reviewed and noted.  Please refer to the obstetrical flow sheet for the fundal height and fetal heart rate documentation:  Patient reports good fetal movement, denies any bleeding and no rupture of membranes symptoms or regular contractions. Has pelvic pressure off and on Patient is without complaints. All questions were answered.  Plan:  Continued routine obstetrical care, PN2 today  Follow up in 3 weeks for OB appointment,

## 2014-09-03 LAB — CBC
Hematocrit: 30.8 % — ABNORMAL LOW (ref 34.0–46.6)
Hemoglobin: 10.2 g/dL — ABNORMAL LOW (ref 11.1–15.9)
MCH: 28.2 pg (ref 26.6–33.0)
MCHC: 33.1 g/dL (ref 31.5–35.7)
MCV: 85 fL (ref 79–97)
PLATELETS: 175 10*3/uL (ref 150–379)
RBC: 3.62 x10E6/uL — ABNORMAL LOW (ref 3.77–5.28)
RDW: 14.3 % (ref 12.3–15.4)
WBC: 11.3 10*3/uL — ABNORMAL HIGH (ref 3.4–10.8)

## 2014-09-03 LAB — GLUCOSE TOLERANCE, 2 HOURS W/ 1HR
GLUCOSE, 1 HOUR: 189 mg/dL — AB (ref 65–179)
Glucose, 2 hour: 177 mg/dL — ABNORMAL HIGH (ref 65–152)
Glucose, Fasting: 94 mg/dL — ABNORMAL HIGH (ref 65–91)

## 2014-09-03 LAB — RPR: RPR Ser Ql: NONREACTIVE

## 2014-09-03 LAB — HIV ANTIBODY (ROUTINE TESTING W REFLEX): HIV Screen 4th Generation wRfx: NONREACTIVE

## 2014-09-03 LAB — HSV 2 ANTIBODY, IGG: HSV 2 Glycoprotein G Ab, IgG: <0.91 index (ref 0.00–0.90)

## 2014-09-03 LAB — ANTIBODY SCREEN: ANTIBODY SCREEN: NEGATIVE

## 2014-09-07 ENCOUNTER — Telehealth: Payer: Self-pay | Admitting: Women's Health

## 2014-09-07 DIAGNOSIS — O24410 Gestational diabetes mellitus in pregnancy, diet controlled: Secondary | ICD-10-CM

## 2014-09-07 MED ORDER — FERROUS SULFATE 325 (65 FE) MG PO TABS: 325.0000 mg | ORAL_TABLET | Freq: Two times a day (BID) | ORAL | Status: DC

## 2014-09-07 NOTE — Telephone Encounter (Signed)
[facility] [place], he did not know what kind of glucose monitor Medicaid would cover.  I called in [facility] with test strips and lancets for 4 X day testing with PRN refills.  Pt aware.

## 2014-09-07 NOTE — Telephone Encounter (Signed)
Nicole Moon, this pt is calling for her lab results, her GTT was abnormal so I didn't know if you wanted to talk to her or if you would like me to call her.  Please advise.

## 2014-09-07 NOTE — Telephone Encounter (Signed)
Pt had called to check on labs. Returned her call, notified her she does have GDM- all levels were elevated, 29wks now. Will go ahead and rx glucometer, strips, lancets today to begin checking QID. Order placed for dietician referral. Rx for Ferrous sulfate for mild anemia- to increase fe-rich foods, continue pnv daily as well. Transferred to front to make appt for 1wk to see how sugars are doing.  Cheral MarkerKimberly R. Booker, CNM, [facility]NP-BC 09/07/2014 11:42 AM

## 2014-09-09 ENCOUNTER — Encounter: Payer: BLUE CROSS/BLUE SHIELD | Admitting: Women's Health

## 2014-09-09 ENCOUNTER — Encounter: Payer: Self-pay | Admitting: Women's Health

## 2014-09-23 ENCOUNTER — Encounter: Payer: BLUE CROSS/BLUE SHIELD | Admitting: Women's Health

## 2014-09-30 ENCOUNTER — Encounter: Payer: BLUE CROSS/BLUE SHIELD | Admitting: Women's Health

## 2014-10-05 ENCOUNTER — Ambulatory Visit (INDEPENDENT_AMBULATORY_CARE_PROVIDER_SITE_OTHER): Payer: BLUE CROSS/BLUE SHIELD | Admitting: Women's Health

## 2014-10-05 VITALS — BP 120/74 | HR 86 | Wt 252.0 lb

## 2014-10-05 DIAGNOSIS — O09893 Supervision of other high risk pregnancies, third trimester: Secondary | ICD-10-CM

## 2014-10-05 DIAGNOSIS — Z1389 Encounter for screening for other disorder: Secondary | ICD-10-CM

## 2014-10-05 DIAGNOSIS — O360121 Maternal care for anti-D [Rh] antibodies, second trimester, fetus 1: Secondary | ICD-10-CM | POA: Diagnosis not present

## 2014-10-05 DIAGNOSIS — O24410 Gestational diabetes mellitus in pregnancy, diet controlled: Secondary | ICD-10-CM

## 2014-10-05 DIAGNOSIS — Z3493 Encounter for supervision of normal pregnancy, unspecified, third trimester: Secondary | ICD-10-CM

## 2014-10-05 DIAGNOSIS — O26843 Uterine size-date discrepancy, third trimester: Secondary | ICD-10-CM

## 2014-10-05 DIAGNOSIS — Z331 Pregnant state, incidental: Secondary | ICD-10-CM

## 2014-10-05 LAB — POCT URINALYSIS DIPSTICK
Blood, UA: NEGATIVE
Glucose, UA: NEGATIVE
Ketones, UA: NEGATIVE
LEUKOCYTES UA: NEGATIVE
NITRITE UA: NEGATIVE
PROTEIN UA: NEGATIVE

## 2014-10-05 MED ORDER — RHO D IMMUNE GLOBULIN 1500 UNIT/2ML IJ SOSY
300.0000 ug | PREFILLED_SYRINGE | Freq: Once | INTRAMUSCULAR | Status: AC
  Administered 2014-10-05: 300 ug via INTRAMUSCULAR

## 2014-10-05 NOTE — Patient Instructions (Signed)
Call the office [phone]) or go to [facility] if:  You begin to have strong, frequent contractions  Your water breaks.  Sometimes it is a big gush of fluid, sometimes it is just a trickle that keeps getting your panties wet or running down your legs  You have vaginal bleeding.  It is normal to have a small amount of spotting if your cervix was checked.   You don't feel your baby moving like normal.  If you don't, get you something to eat and drink and lay down and focus on feeling your baby move.  You should feel at least 10 movements in 2 hours.  If you don't, you should call the office or go to [facility].    Tdap Vaccine  It is recommended that you get the Tdap vaccine during the third trimester of EACH pregnancy to help protect your baby from getting pertussis (whooping cough)  27-36 weeks is the BEST time to do this so that you can pass the protection on to your baby. During pregnancy is better than after pregnancy, but if you are unable to get it during pregnancy it will be offered at the hospital.   You can get this vaccine at the health department or your family doctor  Everyone who will be around your baby should also be up-to-date on their vaccines. Adults (who are not pregnant) only need 1 dose of Tdap during adulthood.    Gestational Diabetes Mellitus Gestational diabetes mellitus, often simply referred to as gestational diabetes, is a type of diabetes that some women develop during pregnancy. In gestational diabetes, the pancreas does not make enough insulin (a hormone), the cells are less responsive to the insulin that is made (insulin resistance), or both.Normally, insulin moves sugars from food into the tissue cells. The tissue cells use the sugars for energy. The lack of insulin or the lack of normal response to insulin causes excess sugars to build up in the blood instead of going into the tissue cells. As a result, high blood sugar (hyperglycemia) develops. The  effect of high sugar (glucose) levels can cause many problems.  RISK FACTORS You have an increased chance of developing gestational diabetes if you have a family history of diabetes and also have one or more of the following risk factors:  A body mass index over 30 (obesity).  A previous pregnancy with gestational diabetes.  An older age at the time of pregnancy. If blood glucose levels are kept in the normal range during pregnancy, women can have a healthy pregnancy. If your blood glucose levels are not well controlled, there may be risks to you, your unborn baby (fetus), your labor and delivery, or your newborn baby.  SYMPTOMS  If symptoms are experienced, they are much like symptoms you would normally expect during pregnancy. The symptoms of gestational diabetes include:   Increased thirst (polydipsia).  Increased urination (polyuria).  Increased urination during the night (nocturia).  Weight loss. This weight loss may be rapid.  Frequent, recurring infections.  Tiredness (fatigue).  Weakness.  Vision changes, such as blurred vision.  Fruity smell to your breath.  Abdominal pain. DIAGNOSIS Diabetes is diagnosed when blood glucose levels are increased. Your blood glucose level may be checked by one or more of the following blood tests:  A fasting blood glucose test. You will not be allowed to eat for at least 8 hours before a blood sample is taken.  A random blood glucose test. Your blood glucose is checked at  any time of the day regardless of when you ate.  A hemoglobin A1c blood glucose test. A hemoglobin A1c test provides information about blood glucose control over the previous 3 months.  An oral glucose tolerance test (OGTT). Your blood glucose is measured after you have not eaten (fasted) for 1-3 hours and then after you drink a glucose-containing beverage. Since the hormones that cause insulin resistance are highest at about 24-28 weeks of a pregnancy, an OGTT is  usually performed during that time. If you have risk factors for gestational diabetes, your health care provider may test you for gestational diabetes earlier than 24 weeks of pregnancy. TREATMENT   You will need to take diabetes medicine or insulin daily to keep blood glucose levels in the desired range.  You will need to match insulin dosing with exercise and healthy food choices. The treatment goal is to maintain the before-meal (preprandial), bedtime, and overnight blood glucose level at 60-99 mg/dL during pregnancy. The treatment goal is to further maintain peak after-meal blood sugar (postprandial glucose) level at 100-140 mg/dL. HOME CARE INSTRUCTIONS   Have your hemoglobin A1c level checked twice a year.  Perform daily blood glucose monitoring as directed by your health care provider. It is common to perform frequent blood glucose monitoring.  Monitor urine ketones when you are ill and as directed by your health care provider.  Take your diabetes medicine and insulin as directed by your health care provider to maintain your blood glucose level in the desired range.  Never run out of diabetes medicine or insulin. It is needed every day.  Adjust insulin based on your intake of carbohydrates. Carbohydrates can raise blood glucose levels but need to be included in your diet. Carbohydrates provide vitamins, minerals, and fiber which are an essential part of a healthy diet. Carbohydrates are found in fruits, vegetables, whole grains, dairy products, legumes, and foods containing added sugars.  Eat healthy foods. Alternate 3 meals with 3 snacks.  Maintain a healthy weight gain. The usual total expected weight gain varies according to your prepregnancy body mass index (BMI).  Carry a medical alert card or wear your medical alert jewelry.  Carry a 15-gram carbohydrate snack with you at all times to treat low blood glucose (hypoglycemia). Some examples of 15-gram carbohydrate snacks  include:  Glucose tablets, 3 or 4.  Glucose gel, 15-gram tube.  Raisins, 2 tablespoons (24 g).  Jelly beans, 6.  Animal crackers, 8.  Fruit juice, regular soda, or low-fat milk, 4 ounces (120 mL).  Gummy treats, 9.  Recognize hypoglycemia. Hypoglycemia during pregnancy occurs with blood glucose levels of 60 mg/dL and below. The risk for hypoglycemia increases when fasting or skipping meals, during or after intense exercise, and during sleep. Hypoglycemia symptoms can include:  Tremors or shakes.  Decreased ability to concentrate.  Sweating.  Increased heart rate.  Headache.  Dry mouth.  Hunger.  Irritability.  Anxiety.  Restless sleep.  Altered speech or coordination.  Confusion.  Treat hypoglycemia promptly. If you are alert and able to safely swallow, follow the 15:15 rule:  Take 15-20 grams of rapid-acting glucose or carbohydrate. Rapid-acting options include glucose gel, glucose tablets, or 4 ounces (120 mL) of fruit juice, regular soda, or low-fat milk.  Check your blood glucose level 15 minutes after taking the glucose.  Take 15-20 grams more of glucose if the repeat blood glucose level is still 70 mg/dL or below.  Eat a meal or snack within 1 hour once blood glucose levels  return to normal.  Be alert to polyuria (excess urination) and polydipsia (excess thirst) which are early signs of hyperglycemia. An early awareness of hyperglycemia allows for prompt treatment. Treat hyperglycemia as directed by your health care provider.  Engage in at least 30 minutes of physical activity a day or as directed by your health care provider. Ten minutes of physical activity timed 30 minutes after each meal is encouraged to control postprandial blood glucose levels.  Adjust your insulin dosing and food intake as needed if you start a new exercise or sport.  Follow your sick-day plan at any time you are unable to eat or drink as usual.  Avoid tobacco and alcohol  use.  Keep all follow-up visits as directed by your health care provider.  Follow the advice of your health care provider regarding your prenatal and post-delivery (postpartum) appointments, meal planning, exercise, medicines, vitamins, blood tests, other medical tests, and physical activities.  Perform daily skin and foot care. Examine your skin and feet daily for cuts, bruises, redness, nail problems, bleeding, blisters, or sores.  Brush your teeth and gums at least twice a day and floss at least once a day. Follow up with your dentist regularly.  Schedule an eye exam during the first trimester of your pregnancy or as directed by your health care provider.  Share your diabetes management plan with your workplace or school.  Stay up-to-date with immunizations.  Learn to manage stress.  Obtain ongoing diabetes education and support as needed.  Learn about and consider breastfeeding your baby.  You should have your blood sugar level checked 6-12 weeks after delivery. This is done with an oral glucose tolerance test (OGTT). SEEK MEDICAL CARE IF:   You are unable to eat food or drink fluids for more than 6 hours.  You have nausea and vomiting for more than 6 hours.  You have a blood glucose level of 200 mg/dL and you have ketones in your urine.  There is a change in mental status.  You develop vision problems.  You have a persistent headache.  You have upper abdominal pain or discomfort.  You develop an additional serious illness.  You have diarrhea for more than 6 hours.  You have been sick or have had a fever for a couple of days and are not getting better. SEEK IMMEDIATE MEDICAL CARE IF:   You have difficulty breathing.  You no longer feel the baby moving.  You are bleeding or have discharge from your vagina.  You start having premature contractions or labor. MAKE SURE YOU:  Understand these instructions.  Will watch your condition.  Will get help right away  if you are not doing well or get worse. Document Released: 07/17/2000 Document Revised: 08/25/2013 Document Reviewed: 11/07/2011 [facility] Patient Information 2015 [place], [place]. This information is not intended to replace advice given to you by your health care provider. Make sure you discuss any questions you have with your health care provider.

## 2014-10-05 NOTE — Addendum Note (Signed)
Addended by: Colen Darling on: 10/05/2014 11:39 AM   Modules accepted: Orders

## 2014-10-05 NOTE — Progress Notes (Signed)
High Risk Pregnancy Diagnosis(es): A1DM O0H2122 [redacted]w[redacted]d Estimated Date of Delivery: 11/23/14 BP 120/74 mmHg  Pulse 86  Wt 252 lb (114.306 kg)  LMP 03/14/2014  Urinalysis: Negative HPI:  Swelling feet/hands, Denies ha, scotomata, ruq/epigastric pain, n/v.  H/O HELLP 1st preg- only sign was RUQ pain. Dietician called her, but they discussed since she went to class w/ last pregnancy she wouldn't need to go again. Pt states she remembers all info. Has been checking sugars. 1 fbs >90, maybe 3 2hr pp >120 but 'not by much'. Forgot log.  BP, weight, and urine reviewed.  Reports good fm. Denies regular uc's, lof, vb, uti s/s. No complaints.  Fundal Height:  37 Fetal Heart rate:  152 Edema:  trace  Reviewed ptl s/s, fkc. Recommended Tdap at HD/PCP per CDC guidelines. Discussed pre-e s/s, to let us know if develops.  All questions were answered Assessment: [redacted]w[redacted]d A1DM Medication(s) Plans:  None for now Treatment Plan:  Will get u/s for s>d, then repeat ~38wks, and deliver @ 40wks if remains A1 Follow up in 1wk for high-risk OB appt and efw/afi u/s for s>d/A1DM Rhogam today

## 2014-10-13 ENCOUNTER — Ambulatory Visit (INDEPENDENT_AMBULATORY_CARE_PROVIDER_SITE_OTHER): Payer: BLUE CROSS/BLUE SHIELD

## 2014-10-13 ENCOUNTER — Encounter: Payer: Self-pay | Admitting: Obstetrics & Gynecology

## 2014-10-13 ENCOUNTER — Ambulatory Visit (INDEPENDENT_AMBULATORY_CARE_PROVIDER_SITE_OTHER): Payer: BLUE CROSS/BLUE SHIELD | Admitting: Obstetrics & Gynecology

## 2014-10-13 VITALS — BP 110/70 | HR 84 | Wt 250.0 lb

## 2014-10-13 DIAGNOSIS — O09893 Supervision of other high risk pregnancies, third trimester: Secondary | ICD-10-CM

## 2014-10-13 DIAGNOSIS — O26843 Uterine size-date discrepancy, third trimester: Secondary | ICD-10-CM | POA: Diagnosis not present

## 2014-10-13 DIAGNOSIS — O24410 Gestational diabetes mellitus in pregnancy, diet controlled: Secondary | ICD-10-CM

## 2014-10-13 DIAGNOSIS — Z331 Pregnant state, incidental: Secondary | ICD-10-CM

## 2014-10-13 DIAGNOSIS — Z1389 Encounter for screening for other disorder: Secondary | ICD-10-CM

## 2014-10-13 LAB — POCT URINALYSIS DIPSTICK
GLUCOSE UA: NEGATIVE
KETONES UA: NEGATIVE
LEUKOCYTES UA: NEGATIVE
Nitrite, UA: NEGATIVE
Protein, UA: NEGATIVE

## 2014-10-13 NOTE — Progress Notes (Signed)
Fetal Surveillance Testing today:  sonogram   High Risk Pregnancy Diagnosis(es):   Class A1 DM  L4T6256 [redacted]w[redacted]d Estimated Date of Delivery: 11/23/14  Blood pressure 110/70, pulse 84, weight 250 lb (113.399 kg), last menstrual period 03/14/2014, not currently breastfeeding.  Urinalysis: Negative   HPI: The patient is being seen today for ongoing management of class A1 dm. Today she reports pelvic pressure   BP weight and urine results all reviewed and noted. Patient reports good fetal movement, denies any bleeding and no rupture of membranes symptoms or regular contractions.  Fundal Height:  38 Fetal Heart rate:  145 Edema:  none  Patient is without complaints other than noted in her HPI. All questions were answered.  All lab and sonogram results have been reviewed. Comments: abnormal elevated 2 hour  Assessment:  1.  Pregnancy at [redacted]w[redacted]d,  Estimated Date of Delivery: 11/23/14 :                          2.  Class A1 DM                        3.  Borderline poly with normal EFW percentile  Medication(s) Plans:  No changes  Treatment Plan:  Repeat eval in 2-3 weeks  Follow up in 2 weeks for appointment for high risk OB care,

## 2014-10-13 NOTE — Progress Notes (Signed)
[place] 34+1wks,EFW 2590g 61.2%,cephalic,ant pl gr 3,afi 21cm poly,fht 145bpm,dilated rt renal pelvis 1.5cm,normal lt kidney,normal ov's bilat

## 2014-10-28 ENCOUNTER — Encounter: Payer: Self-pay | Admitting: Obstetrics and Gynecology

## 2014-10-28 ENCOUNTER — Ambulatory Visit (INDEPENDENT_AMBULATORY_CARE_PROVIDER_SITE_OTHER): Payer: BLUE CROSS/BLUE SHIELD | Admitting: Obstetrics and Gynecology

## 2014-10-28 VITALS — BP 100/62 | HR 76 | Wt 253.5 lb

## 2014-10-28 DIAGNOSIS — O09893 Supervision of other high risk pregnancies, third trimester: Secondary | ICD-10-CM | POA: Diagnosis not present

## 2014-10-28 DIAGNOSIS — Z1389 Encounter for screening for other disorder: Secondary | ICD-10-CM

## 2014-10-28 DIAGNOSIS — O24410 Gestational diabetes mellitus in pregnancy, diet controlled: Secondary | ICD-10-CM

## 2014-10-28 DIAGNOSIS — Z3A36 36 weeks gestation of pregnancy: Secondary | ICD-10-CM

## 2014-10-28 DIAGNOSIS — Z331 Pregnant state, incidental: Secondary | ICD-10-CM

## 2014-10-28 DIAGNOSIS — Z3493 Encounter for supervision of normal pregnancy, unspecified, third trimester: Secondary | ICD-10-CM

## 2014-10-28 DIAGNOSIS — O360131 Maternal care for anti-D [Rh] antibodies, third trimester, fetus 1: Secondary | ICD-10-CM

## 2014-10-28 LAB — POCT URINALYSIS DIPSTICK
Blood, UA: NEGATIVE
GLUCOSE UA: NEGATIVE
KETONES UA: NEGATIVE
LEUKOCYTES UA: NEGATIVE
Nitrite, UA: NEGATIVE
Protein, UA: NEGATIVE

## 2014-10-28 NOTE — Progress Notes (Signed)
Pt denies any problems or concerns at this time.  

## 2014-10-28 NOTE — Progress Notes (Signed)
High Risk Pregnancy Diagnosis(es):  Glucose intolerance , hx GDM prior preg  Blood pressure 100/62, pulse 76, weight 253 lb 8 oz (114.987 kg), last menstrual period 03/14/2014, not currently breastfeeding. HPI: [id number] 5930w2d Estimated Date of Delivery: 11/23/14          BP weight and urine results reviewed and notable for normal. Patient reports    good fetal movement, denies any bleeding and no rupture of membranes symptoms or regular contractions .  Fundal Height:  41 s>D Fetal Heart rate:  147 Edema:  minimal Urinalysis: Negative cbg's fine even July 4 .Assessment:4130w2d,   Diet control of sugars excellent                         S>D, will check EFW at 38 wk  Medication(s) Plans:  none  Treatment Plan:        U/s at 38         Follow up:           1 weeks for   pn visitU/s for Efw 2 wk All questions were answered.

## 2014-10-31 ENCOUNTER — Inpatient Hospital Stay (HOSPITAL_COMMUNITY): Payer: Medicaid Other | Admitting: Anesthesiology

## 2014-10-31 ENCOUNTER — Encounter (HOSPITAL_COMMUNITY): Payer: Self-pay | Admitting: General Practice

## 2014-10-31 ENCOUNTER — Inpatient Hospital Stay (HOSPITAL_COMMUNITY)
Admission: AD | Admit: 2014-10-31 | Discharge: 2014-11-02 | DRG: 775 | Disposition: A | Discharge status: Home / Self Care | Payer: Medicaid Other | Source: Ambulatory Visit | Attending: Obstetrics & Gynecology | Admitting: Obstetrics & Gynecology

## 2014-10-31 DIAGNOSIS — O24410 Gestational diabetes mellitus in pregnancy, diet controlled: Secondary | ICD-10-CM | POA: Diagnosis present

## 2014-10-31 DIAGNOSIS — O99214 Obesity complicating childbirth: Secondary | ICD-10-CM | POA: Diagnosis present

## 2014-10-31 DIAGNOSIS — Z3A36 36 weeks gestation of pregnancy: Secondary | ICD-10-CM | POA: Diagnosis present

## 2014-10-31 DIAGNOSIS — O42913 Preterm premature rupture of membranes, unspecified as to length of time between rupture and onset of labor, third trimester: Secondary | ICD-10-CM | POA: Diagnosis present

## 2014-10-31 DIAGNOSIS — Z87891 Personal history of nicotine dependence: Secondary | ICD-10-CM | POA: Diagnosis not present

## 2014-10-31 DIAGNOSIS — O4290 Premature rupture of membranes, unspecified as to length of time between rupture and onset of labor, unspecified weeks of gestation: Secondary | ICD-10-CM | POA: Diagnosis present

## 2014-10-31 DIAGNOSIS — Z6841 Body Mass Index (BMI) 40.0 and over, adult: Secondary | ICD-10-CM | POA: Diagnosis not present

## 2014-10-31 DIAGNOSIS — Z8249 Family history of ischemic heart disease and other diseases of the circulatory system: Secondary | ICD-10-CM | POA: Diagnosis not present

## 2014-10-31 LAB — RPR: RPR Ser Ql: NONREACTIVE

## 2014-10-31 LAB — CBC
HCT: 32.4 % — ABNORMAL LOW (ref 36.0–46.0)
Hemoglobin: 10.5 g/dL — ABNORMAL LOW (ref 12.0–15.0)
MCH: 27.4 pg (ref 26.0–34.0)
MCHC: 32.4 g/dL (ref 30.0–36.0)
MCV: 84.6 fL (ref 78.0–100.0)
PLATELETS: 134 10*3/uL — AB (ref 150–400)
RBC: 3.83 MIL/uL — AB (ref 3.87–5.11)
RDW: 14.6 % (ref 11.5–15.5)
WBC: 11.3 10*3/uL — AB (ref 4.0–10.5)

## 2014-10-31 LAB — TYPE AND SCREEN
ABO/RH(D): O NEG
ANTIBODY SCREEN: POSITIVE
DAT, IGG: NEGATIVE

## 2014-10-31 LAB — GROUP B STREP BY PCR: Group B strep by PCR: POSITIVE — AB

## 2014-10-31 LAB — GLUCOSE, CAPILLARY
GLUCOSE-CAPILLARY: 93 mg/dL (ref 65–99)
Glucose-Capillary: 97 mg/dL (ref 65–99)
Glucose-Capillary: 155 mg/dL — ABNORMAL HIGH (ref 65–99)

## 2014-10-31 LAB — OB RESULTS CONSOLE GBS: GBS: POSITIVE

## 2014-10-31 MED ORDER — ONDANSETRON HCL 4 MG PO TABS: 4.0000 mg | ORAL_TABLET | ORAL | Status: DC | PRN

## 2014-10-31 MED ORDER — FENTANYL 2.5 MCG/ML BUPIVACAINE 1/10 % EPIDURAL INFUSION (WH - ANES)
INTRAMUSCULAR | Status: AC
  Filled 2014-10-31: qty 125

## 2014-10-31 MED ORDER — IBUPROFEN 600 MG PO TABS
600.0000 mg | ORAL_TABLET | Freq: Four times a day (QID) | ORAL | Status: DC
  Administered 2014-11-01 – 2014-11-02 (×7): 600 mg via ORAL
  Filled 2014-10-31 (×7): qty 1

## 2014-10-31 MED ORDER — SODIUM CHLORIDE 0.9 % IJ SOLN: 3.0000 mL | Freq: Two times a day (BID) | INTRAMUSCULAR | Status: DC

## 2014-10-31 MED ORDER — PENICILLIN G POTASSIUM 5000000 UNITS IJ SOLR
2.5000 10*6.[IU] | INTRAVENOUS | Status: DC
  Filled 2014-10-31 (×3): qty 2.5

## 2014-10-31 MED ORDER — ACETAMINOPHEN 325 MG PO TABS
650.0000 mg | ORAL_TABLET | ORAL | Status: DC | PRN
  Administered 2014-11-01: 650 mg via ORAL
  Filled 2014-10-31: qty 2

## 2014-10-31 MED ORDER — SODIUM CHLORIDE 0.9 % IV SOLN: 250.0000 mL | INTRAVENOUS | Status: DC | PRN

## 2014-10-31 MED ORDER — BETAMETHASONE SOD PHOS & ACET 6 (3-3) MG/ML IJ SUSP
12.0000 mg | Freq: Every day | INTRAMUSCULAR | Status: DC
  Administered 2014-10-31: 12 mg via INTRAMUSCULAR
  Filled 2014-10-31 (×2): qty 2

## 2014-10-31 MED ORDER — TERBUTALINE SULFATE 1 MG/ML IJ SOLN: 0.2500 mg | Freq: Once | INTRAMUSCULAR | Status: DC | PRN

## 2014-10-31 MED ORDER — SIMETHICONE 80 MG PO CHEW: 80.0000 mg | CHEWABLE_TABLET | ORAL | Status: DC | PRN

## 2014-10-31 MED ORDER — PHENYLEPHRINE 40 MCG/ML (10ML) SYRINGE FOR IV PUSH (FOR BLOOD PRESSURE SUPPORT)
PREFILLED_SYRINGE | INTRAVENOUS | Status: AC
  Filled 2014-10-31: qty 20

## 2014-10-31 MED ORDER — PHENYLEPHRINE 40 MCG/ML (10ML) SYRINGE FOR IV PUSH (FOR BLOOD PRESSURE SUPPORT): 80.0000 ug | PREFILLED_SYRINGE | INTRAVENOUS | Status: DC | PRN

## 2014-10-31 MED ORDER — PENICILLIN G POTASSIUM 5000000 UNITS IJ SOLR
5.0000 10*6.[IU] | Freq: Once | INTRAVENOUS | Status: AC
  Administered 2014-10-31: 5 10*6.[IU] via INTRAVENOUS
  Filled 2014-10-31: qty 5

## 2014-10-31 MED ORDER — SODIUM CHLORIDE 0.9 % IJ SOLN: 3.0000 mL | INTRAMUSCULAR | Status: DC | PRN

## 2014-10-31 MED ORDER — WITCH HAZEL-GLYCERIN EX PADS: 1.0000 "application " | MEDICATED_PAD | CUTANEOUS | Status: DC | PRN

## 2014-10-31 MED ORDER — ONDANSETRON HCL 4 MG/2ML IJ SOLN: 4.0000 mg | Freq: Four times a day (QID) | INTRAMUSCULAR | Status: DC | PRN

## 2014-10-31 MED ORDER — LANOLIN HYDROUS EX OINT: TOPICAL_OINTMENT | CUTANEOUS | Status: DC | PRN

## 2014-10-31 MED ORDER — OXYTOCIN 40 UNITS IN LACTATED RINGERS INFUSION - SIMPLE MED: 62.5000 mL/h | INTRAVENOUS | Status: DC

## 2014-10-31 MED ORDER — PRENATAL MULTIVITAMIN CH
1.0000 | ORAL_TABLET | Freq: Every day | ORAL | Status: DC
  Administered 2014-11-01 – 2014-11-02 (×2): 1 via ORAL
  Filled 2014-10-31 (×2): qty 1

## 2014-10-31 MED ORDER — DIPHENHYDRAMINE HCL 50 MG/ML IJ SOLN: 12.5000 mg | INTRAMUSCULAR | Status: DC | PRN

## 2014-10-31 MED ORDER — ACETAMINOPHEN 325 MG PO TABS: 650.0000 mg | ORAL_TABLET | ORAL | Status: DC | PRN

## 2014-10-31 MED ORDER — LACTATED RINGERS IV SOLN: 500.0000 mL | INTRAVENOUS | Status: DC | PRN

## 2014-10-31 MED ORDER — OXYTOCIN 40 UNITS IN LACTATED RINGERS INFUSION - SIMPLE MED: 62.5000 mL/h | INTRAVENOUS | Status: DC | PRN

## 2014-10-31 MED ORDER — EPHEDRINE 5 MG/ML INJ: 10.0000 mg | INTRAVENOUS | Status: DC | PRN

## 2014-10-31 MED ORDER — LIDOCAINE HCL (PF) 1 % IJ SOLN
30.0000 mL | INTRAMUSCULAR | Status: DC | PRN
  Filled 2014-10-31: qty 30

## 2014-10-31 MED ORDER — DIBUCAINE 1 % RE OINT
1.0000 "application " | TOPICAL_OINTMENT | RECTAL | Status: DC | PRN
  Filled 2014-10-31: qty 28

## 2014-10-31 MED ORDER — CITRIC ACID-SODIUM CITRATE 334-500 MG/5ML PO SOLN: 30.0000 mL | ORAL | Status: DC | PRN

## 2014-10-31 MED ORDER — OXYTOCIN 40 UNITS IN LACTATED RINGERS INFUSION - SIMPLE MED
1.0000 m[IU]/min | INTRAVENOUS | Status: DC
  Filled 2014-10-31 (×2): qty 1000

## 2014-10-31 MED ORDER — LACTATED RINGERS IV SOLN
INTRAVENOUS | Status: DC
  Administered 2014-10-31 (×2): via INTRAVENOUS

## 2014-10-31 MED ORDER — OXYCODONE-ACETAMINOPHEN 5-325 MG PO TABS: 2.0000 | ORAL_TABLET | ORAL | Status: DC | PRN

## 2014-10-31 MED ORDER — ONDANSETRON HCL 4 MG/2ML IJ SOLN: 4.0000 mg | INTRAMUSCULAR | Status: DC | PRN

## 2014-10-31 MED ORDER — DIPHENHYDRAMINE HCL 25 MG PO CAPS: 25.0000 mg | ORAL_CAPSULE | Freq: Four times a day (QID) | ORAL | Status: DC | PRN

## 2014-10-31 MED ORDER — FLEET ENEMA 7-19 GM/118ML RE ENEM: 1.0000 | ENEMA | Freq: Every day | RECTAL | Status: DC | PRN

## 2014-10-31 MED ORDER — INSULIN REGULAR HUMAN 100 UNIT/ML IJ SOLN
6.0000 [IU] | Freq: Once | INTRAMUSCULAR | Status: AC
  Administered 2014-10-31: 6 [IU] via SUBCUTANEOUS
  Filled 2014-10-31: qty 0.06

## 2014-10-31 MED ORDER — BENZOCAINE-MENTHOL 20-0.5 % EX AERO: 1.0000 "application " | INHALATION_SPRAY | CUTANEOUS | Status: DC | PRN

## 2014-10-31 MED ORDER — BISACODYL 10 MG RE SUPP
10.0000 mg | Freq: Every day | RECTAL | Status: DC | PRN
  Filled 2014-10-31: qty 1

## 2014-10-31 MED ORDER — OXYTOCIN BOLUS FROM INFUSION
500.0000 mL | INTRAVENOUS | Status: DC
  Administered 2014-10-31: 500 mL via INTRAVENOUS

## 2014-10-31 MED ORDER — OXYCODONE-ACETAMINOPHEN 5-325 MG PO TABS: 1.0000 | ORAL_TABLET | ORAL | Status: DC | PRN

## 2014-10-31 MED ORDER — FENTANYL 2.5 MCG/ML BUPIVACAINE 1/10 % EPIDURAL INFUSION (WH - ANES)
14.0000 mL/h | INTRAMUSCULAR | Status: DC | PRN
  Administered 2014-10-31: 14 mL/h via EPIDURAL

## 2014-10-31 MED ORDER — OXYTOCIN 40 UNITS IN LACTATED RINGERS INFUSION - SIMPLE MED
1.0000 m[IU]/min | INTRAVENOUS | Status: DC
  Administered 2014-10-31: 2 m[IU]/min via INTRAVENOUS

## 2014-10-31 MED ORDER — LIDOCAINE HCL (PF) 1 % IJ SOLN
INTRAMUSCULAR | Status: DC | PRN
  Administered 2014-10-31 (×2): 9 mL

## 2014-10-31 MED ORDER — ZOLPIDEM TARTRATE 5 MG PO TABS: 5.0000 mg | ORAL_TABLET | Freq: Every evening | ORAL | Status: DC | PRN

## 2014-10-31 MED ORDER — SENNOSIDES-DOCUSATE SODIUM 8.6-50 MG PO TABS
2.0000 | ORAL_TABLET | ORAL | Status: DC
  Administered 2014-11-01 – 2014-11-02 (×2): 2 via ORAL
  Filled 2014-10-31 (×2): qty 2

## 2014-10-31 NOTE — H&P (Signed)
Nicole Moon is a 24 y.o. female G3P1102 at 106w5dby 1st trimester [place] for PPROM.  Patient rolled over in bed this AM and felt a gush of fluid around 4am.  She started having irregular contractions about half an hour after ROM. A1DM during this pregnancy, GBS unknown but positive during previous pregnancy. +FM, denies VB.  Girl/bo/Mirena.   [facility]  FOB Nicole Moon  Dating By 1st trimester [place] Pap 05/28/2014 normal  GC/CT Initial:    -/-            36+wks:  Genetic Screen NT/IT: didn't show for 2nd IT  CF screen negatvie  Anatomic [place] female, dilated Rt renal pelvis @ 34wks  Flu vaccine   Tdap Recommended ~ 28wks  Glucose Screen  2 hr 94/189/177 A1DM  GBS   Feed Preference bottle  Contraception IUD   Circumcision n/a  Childbirth Classes declined  Pediatrician [facility]   Maternal Medical History:  Reason for admission: Nausea.    OB History    Gravida Para Term Preterm AB TAB SAB Ectopic Multiple Living   3 2 1 1  0 0 0 0 0 2     Past Medical History  Diagnosis Date  . Asthma   . Gestational diabetes   . History of HELLP syndrome, currently pregnant   . Pregnant 05/13/2014  . Nausea 05/13/2014   Past Surgical History  Procedure Laterality Date  . Ankle surgery    . Fracture surgery     Family History: family history includes Cancer in her paternal grandmother; Heart attack in her paternal grandfather; Heart disease in her father and mother. Social History:  reports that she has quit smoking. She has never used smokeless tobacco. She reports that she does not drink alcohol or use illicit drugs.   Prenatal Transfer Tool  Maternal Diabetes: Yes:  Diabetes Type:  Diet controlled Genetic Screening: Normal Maternal Ultrasounds/Referrals: Abnormal:  Findings:   Other: dilated Rt renal pelvis @ 34wks Fetal Ultrasounds or other Referrals:  None Maternal Substance Abuse:  No Significant Maternal Medications:   None Significant Maternal Lab Results:  Lab values include: Other:  Other Comments:  GBS unknown, PCR pending, rubella nonimmune, dilated Rt renal pelvis @ 34wks  Review of Systems  Constitutional: Negative for fever, chills and weight loss.  HENT: Negative for congestion and sore throat.   Eyes: Negative for blurred vision, double vision, photophobia and pain.  Respiratory: Negative for cough, shortness of breath and wheezing.   Cardiovascular: Negative for chest pain, palpitations and leg swelling.  Gastrointestinal: Negative for heartburn, nausea and vomiting.  Genitourinary: Negative for dysuria, urgency, frequency and hematuria.  Musculoskeletal: Negative for myalgias, joint pain and falls.  Skin: Negative for itching and rash.  Neurological: Negative.  Negative for headaches.  Endo/Heme/Allergies: Negative.   Psychiatric/Behavioral: Negative.     Dilation: 1 Effacement (%): 80 Station: -2 Blood pressure 132/77, pulse 94, temperature 98 F (36.7 C), temperature source Oral, resp. rate 18, height 5' 6"  (1.676 m), weight 251 lb (113.853 kg), last menstrual period 03/14/2014, SpO2 100 %, not currently breastfeeding. Maternal Exam:  Uterine Assessment: Contraction strength is mild.  Contraction frequency is irregular.   Abdomen: Patient reports no abdominal tenderness. Fetal presentation: vertex  Introitus: Amniotic fluid character: clear.  Pelvis: adequate for delivery.      Fetal Exam Fetal Monitor Review: Baseline rate: 135.  Variability: moderate (6-25 bpm).   Pattern: accelerations present  and no decelerations.    Fetal State Assessment: Category I - tracings are normal.     Physical Exam  Constitutional: She is oriented to person, place, and time. She appears well-developed and well-nourished. No distress.  HENT:  Head: Normocephalic and atraumatic.  Mouth/Throat: Oropharynx is clear and moist.  Eyes: EOM are normal. Pupils are equal, round, and reactive to  light. No scleral icterus.  Neck: Normal range of motion. Neck supple.  Cardiovascular: Normal rate, regular rhythm and intact distal pulses.   Respiratory: Effort normal. No respiratory distress. She has no wheezes.  GI: Soft. She exhibits no distension. There is no tenderness.  gravid  Genitourinary: Vagina normal.  Musculoskeletal: She exhibits no edema or tenderness.  Neurological: She is alert and oriented to person, place, and time.  Skin: Skin is warm and dry. No rash noted.  Psychiatric: She has a normal mood and affect. Her behavior is normal. Thought content normal.    Prenatal labs: ABO, Rh: O/--/-- (02/04 1019) Antibody: Negative (05/11 0934) Rubella: Equivocal (02/04 0000) RPR: Non Reactive (05/11 0934)  HBsAg: Negative (02/04 1019)  HIV: Non-reactive (05/11 0000)  GBS:     Assessment/Plan: Nicole Moon is a 24 y.o. [id number] at 34w5dhere for PPROM.  #Labor: expectant management while awaiting GBS PCR #Pain: May have epidural upon request when in active labor #FWB: Cat 1 #ID:  GBS unknown, PCR sent #MOF: bottle #MOC: Mirena #Circ:  N/a - girl # rubella non-immune: MMR postpartum   AVirginia Crews MD, MPH PGY-2,  [facility] 10/31/2014 7:00 AM   CNM attestation:  I have seen and examined this patient; I agree with above documentation in the resident's note.   BLayia Wallais a 24y.o. [phone] for PPROM  PE: BP 124/68 mmHg  Pulse 79  Temp(Src) 97.9 F (36.6 C) (Oral)  Resp 18  Ht 5' 6"  (1.676 m)  Wt 113.853 kg (251 lb)  BMI 40.53 kg/m2  SpO2 99%  LMP 03/14/2014  Breastfeeding? Unknown Gen: calm comfortable, NAD Resp: normal effort, no distress Abd: gravid  ROS, labs, PMH reviewed  Plan: [facility] Expectant management Awaiting GBS PCR  SHAW, Nicole Moon CNM 11/05/2014, 1:38 AM

## 2014-10-31 NOTE — Anesthesia Preprocedure Evaluation (Signed)
Anesthesia Evaluation  Patient identified by MRN, date of birth, ID band Patient awake    Reviewed: Allergy & Precautions, H&P , NPO status , Patient's Chart, lab work & pertinent test results  Airway Mallampati: III  TM Distance: >3 FB Neck ROM: full    Dental no notable dental hx.    Pulmonary neg pulmonary ROS, former smoker,    Pulmonary exam normal       Cardiovascular negative cardio ROS Normal cardiovascular exam    Neuro/Psych negative neurological ROS  negative psych ROS   GI/Hepatic negative GI ROS, Neg liver ROS,   Endo/Other  diabetesMorbid obesity  Renal/GU negative Renal ROS     Musculoskeletal   Abdominal (+) + obese,   Peds  Hematology negative hematology ROS (+)   Anesthesia Other Findings   Reproductive/Obstetrics (+) Pregnancy                             Anesthesia Physical Anesthesia Plan  ASA: III  Anesthesia Plan: Epidural   Post-op Pain Management:    Induction:   Airway Management Planned:   Additional Equipment:   Intra-op Plan:   Post-operative Plan:   Informed Consent: I have reviewed the patients History and Physical, chart, labs and discussed the procedure including the risks, benefits and alternatives for the proposed anesthesia with the patient or authorized representative who has indicated his/her understanding and acceptance.     Plan Discussed with:   Anesthesia Plan Comments:         Anesthesia Quick Evaluation

## 2014-10-31 NOTE — MAU Note (Signed)
Patient states "my water broke at 4am." Patient reports occasional mild contractions and water "keeps coming." Denies vaginal bleeding. Reports +fetal movement.

## 2014-10-31 NOTE — Anesthesia Procedure Notes (Signed)
Epidural Patient location during procedure: OB Start time: 10/31/2014 5:31 PM End time: 10/31/2014 5:35 PM  Staffing Anesthesiologist: Leilani AbleHATCHETT, Corah Willeford Performed by: anesthesiologist   Preanesthetic Checklist Completed: patient identified, surgical consent, pre-op evaluation, timeout performed, IV checked, risks and benefits discussed and monitors and equipment checked  Epidural Patient position: sitting Prep: site prepped and draped and DuraPrep Patient monitoring: continuous pulse ox and blood pressure Approach: midline Location: L3-L4 Injection technique: LOR air  Needle:  Needle type: Tuohy  Needle gauge: 17 G Needle length: 9 cm and 9 Needle insertion depth: 6 cm Catheter type: closed end flexible Catheter size: 19 Gauge Catheter at skin depth: 11 cm Test dose: negative and Other  Assessment Sensory level: T9 Events: blood not aspirated, injection not painful, no injection resistance, negative IV test and no paresthesia  Additional Notes Reason for block:procedure for pain

## 2014-10-31 NOTE — OB Triage Note (Signed)
ROM at 0400 clear fluid large amount

## 2014-10-31 NOTE — MAU Note (Signed)
[facility] Har[facility]ana[facility] Patient to be admitted to room 162.

## 2014-11-01 MED ORDER — OXYCODONE-ACETAMINOPHEN 5-325 MG PO TABS
1.0000 | ORAL_TABLET | ORAL | Status: DC | PRN
  Administered 2014-11-01 – 2014-11-02 (×2): 1 via ORAL
  Filled 2014-11-01 (×2): qty 1

## 2014-11-01 MED ORDER — RHO D IMMUNE GLOBULIN 1500 UNIT/2ML IJ SOSY
300.0000 ug | PREFILLED_SYRINGE | Freq: Once | INTRAMUSCULAR | Status: AC
  Administered 2014-11-01: 300 ug via INTRAVENOUS
  Filled 2014-11-01: qty 2

## 2014-11-01 NOTE — Progress Notes (Signed)
Post Partum Day 1 Subjective:  Nicole Moon is a 24 y.o. [phone] 5768w5d s/p preterm SVD 2/2 PPROM.  No acute events overnight.  Pt denies problems with ambulating, voiding or po intake.  She denies nausea or vomiting.  Pain is well controlled.  She has had flatus.  Lochia Minimal.  Plan for birth control is mirea.  Method of Feeding: bottle  Objective: Blood pressure 122/70, pulse 77, temperature 98 F (36.7 C), temperature source Oral, resp. rate 20, height 5\' 6"  (1.676 m), weight 251 lb (113.853 kg), last menstrual period 03/14/2014, SpO2 99 %, unknown if currently breastfeeding.  Physical Exam:  General: alert, cooperative and no distress Lochia:normal flow Chest: CTAB Heart: RRR no m/r/g Abdomen: +BS, soft, nontender,  Uterine Fundus: firm DVT Evaluation: No evidence of DVT seen on physical exam. Extremities: no edema   Recent Labs  10/31/14 0645  HGB 10.5*  HCT 32.4*    Assessment/Plan:  ASSESSMENT: Nicole Moon is a 24 y.o. [id number] 8268w5d s/p preterm SVD  Plan for discharge tomorrow   LOS: 1 day   Nicole Moon ROCIO 11/01/2014, 8:26 AM

## 2014-11-01 NOTE — Anesthesia Postprocedure Evaluation (Signed)
Anesthesia Post Note  Patient: Nicole Moon  Procedure(s) Performed: * No procedures listed *  Anesthesia type: Epidural  Patient location: Mother/Baby  Post pain: Pain level controlled  Post assessment: Post-op Vital signs reviewed  Last Vitals:  Filed Vitals:   11/01/14 0400  BP: 122/70  Pulse: 77  Temp: 36.7 C  Resp: 20    Post vital signs: Reviewed  Level of consciousness:alert  Complications: No apparent anesthesia complications

## 2014-11-02 LAB — RH IG WORKUP (INCLUDES ABO/RH)
ABO/RH(D): O NEG
Fetal Screen: NEGATIVE
Gestational Age(Wks): 36.5
Unit division: 0

## 2014-11-02 MED ORDER — OXYCODONE-ACETAMINOPHEN 5-325 MG PO TABS: 1.0000 | ORAL_TABLET | ORAL | Status: DC | PRN

## 2014-11-02 MED ORDER — IBUPROFEN 600 MG PO TABS: 600.0000 mg | ORAL_TABLET | Freq: Four times a day (QID) | ORAL | Status: DC

## 2014-11-02 NOTE — Discharge Summary (Signed)
Obstetric Discharge Summary Reason for Admission: induction of labor Prenatal Procedures: ultrasound Intrapartum Procedures: spontaneous vaginal delivery Postpartum Procedures: none Complications-Operative and Postpartum: none HEMOGLOBIN  Date Value Ref Range Status  10/31/2014 10.5* 12.0 - 15.0 g/dL Final   HCT  Date Value Ref Range Status  10/31/2014 32.4* 36.0 - 46.0 % Final   HEMATOCRIT  Date Value Ref Range Status  09/02/2014 30.8* 34.0 - 46.6 % Final    Physical Exam:  General: alert, cooperative, appears stated age and no distress Lochia: appropriate Uterine Fundus: firm Incision: healing well DVT Evaluation: No evidence of DVT seen on physical exam. Negative Homan's sign. No cords or calf tenderness. No significant calf/ankle edema.  Discharge Diagnoses: Term Pregnancy-delivered  Discharge Information: Date: 11/02/2014 Activity: pelvic rest Diet: routine Medications: PNV, Ibuprofen and Percocet Condition: stable and improved Instructions: refer to practice specific booklet Discharge to: home   Newborn Data: Live born female  Birth Weight: 6 lb 4.9 oz (2860 g) APGAR: 9, 9  Home with mother.  Wyvonnia DuskyLAWSON, MARIE DARLENE 11/02/2014, 7:29 AM

## 2014-11-04 ENCOUNTER — Encounter: Payer: Self-pay | Admitting: Advanced Practice Midwife

## 2014-11-04 ENCOUNTER — Encounter: Payer: BLUE CROSS/BLUE SHIELD | Admitting: Advanced Practice Midwife

## 2014-11-05 ENCOUNTER — Encounter: Payer: Self-pay | Admitting: *Deleted

## 2014-11-09 ENCOUNTER — Encounter: Payer: Self-pay | Admitting: Obstetrics & Gynecology

## 2014-11-11 ENCOUNTER — Encounter: Payer: BLUE CROSS/BLUE SHIELD | Admitting: Advanced Practice Midwife

## 2014-11-11 ENCOUNTER — Other Ambulatory Visit: Payer: BLUE CROSS/BLUE SHIELD

## 2014-11-19 IMAGING — US US OB COMP LESS 14 WK
1 series · 14 of 28 positions shown · non-contrast
Comparison: None.

CLINICAL DATA: Abdominal pain in early pregnancy

OBSTETRIC <14 WK US AND TRANSVAGINAL OB US
TECHNIQUE: Both transabdominal and transvaginal ultrasound
examinations were performed for complete evaluation of the
gestation as well as the maternal uterus, adnexal regions, and
pelvic cul-de-sac.  Transvaginal technique was performed to assess
early pregnancy.

[Series 1: us ob comp less 14 wk · 0.28mm/px · 37 acquisitions, 14 frames shown]
[im 2/37]
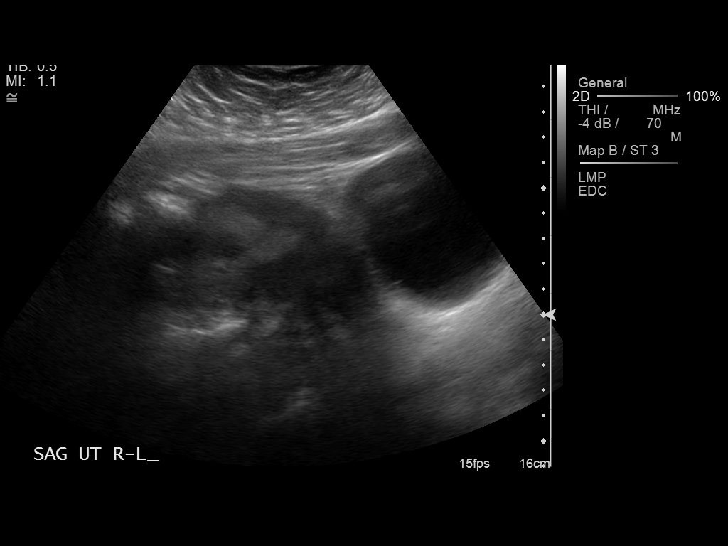
[im 5/37]
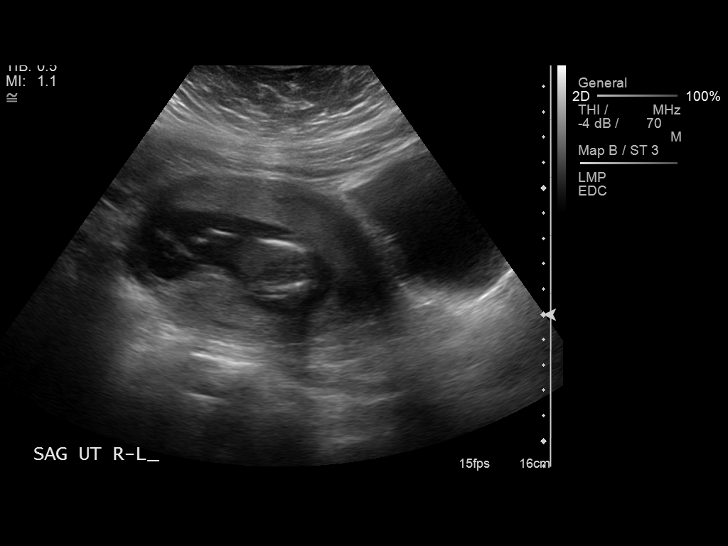
[im 7/37]
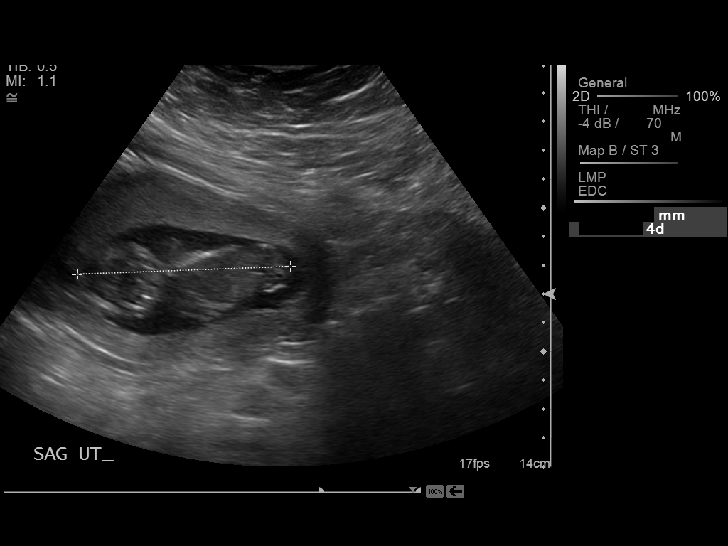
[im 10/37]
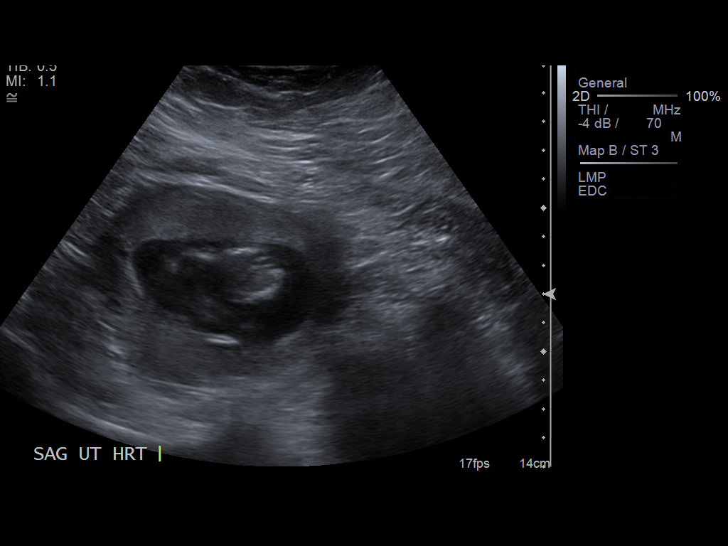
[im 13/37]
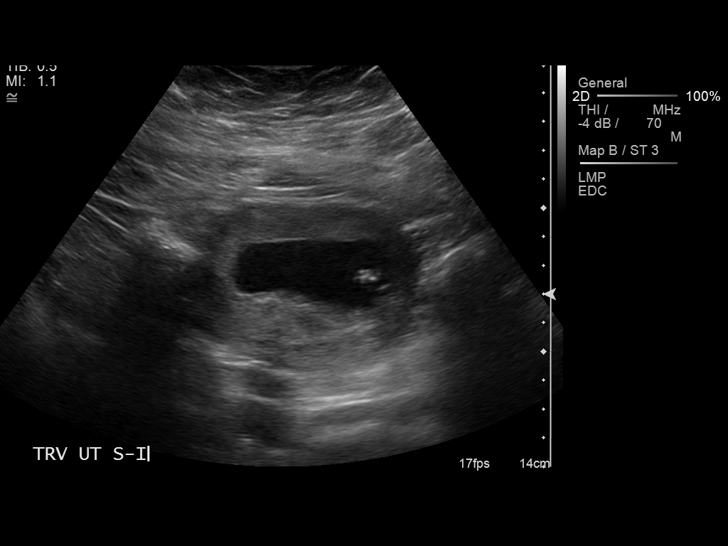
[im 15/37]
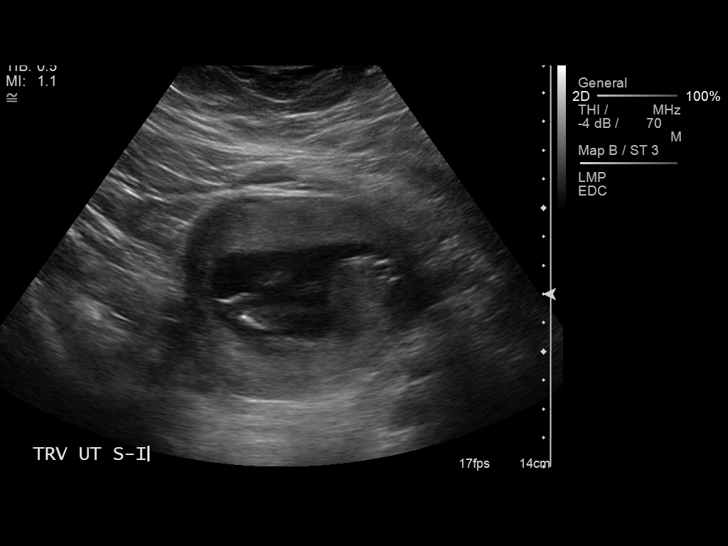
[im 18/37]
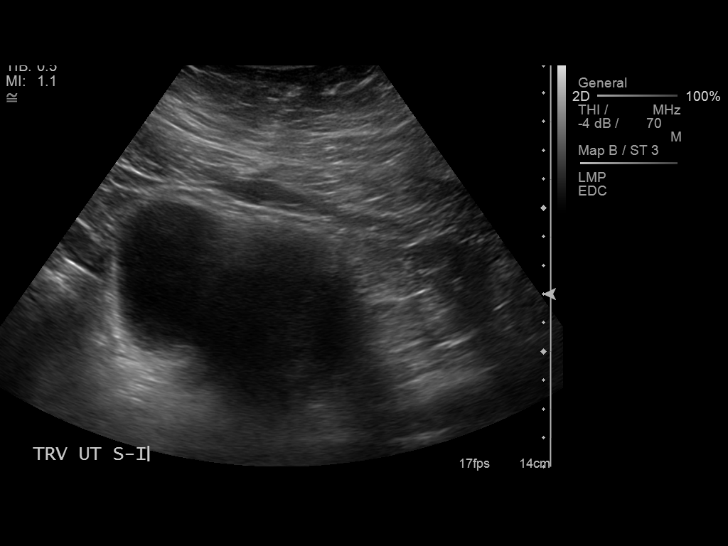
[im 21/37]
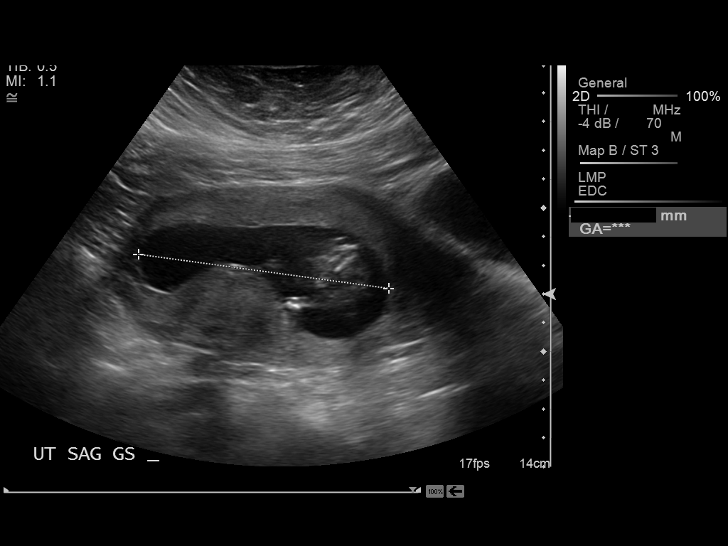
[im 23/37]
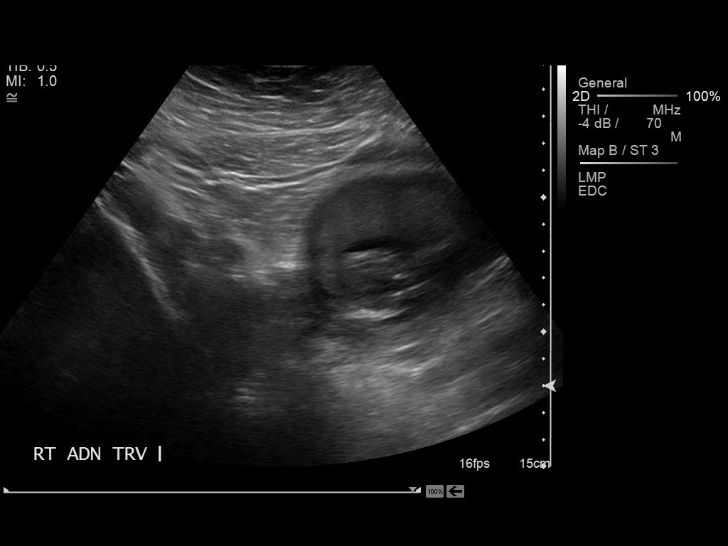
[im 26/37]
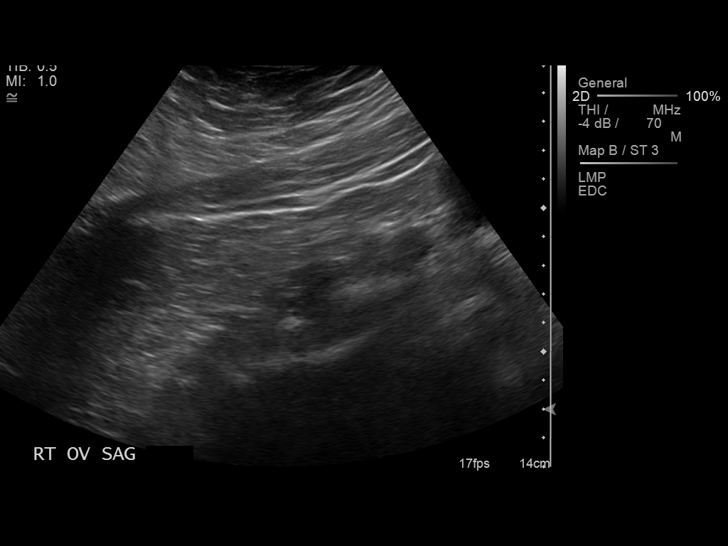
[im 29/37]
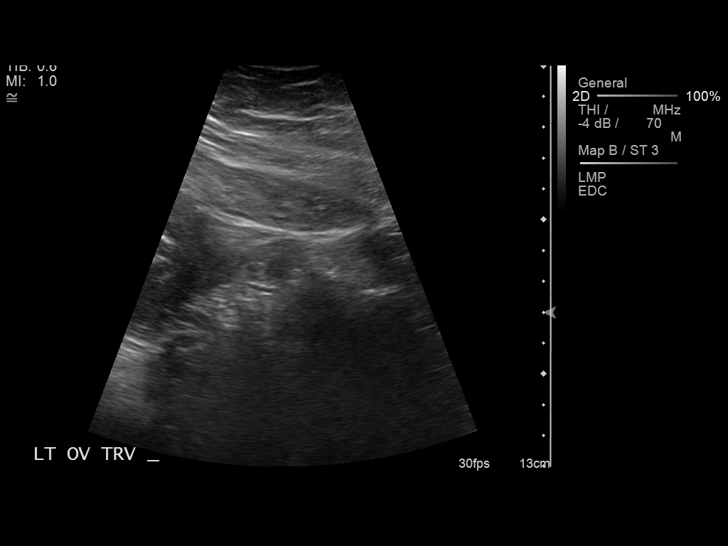
[im 31/37]
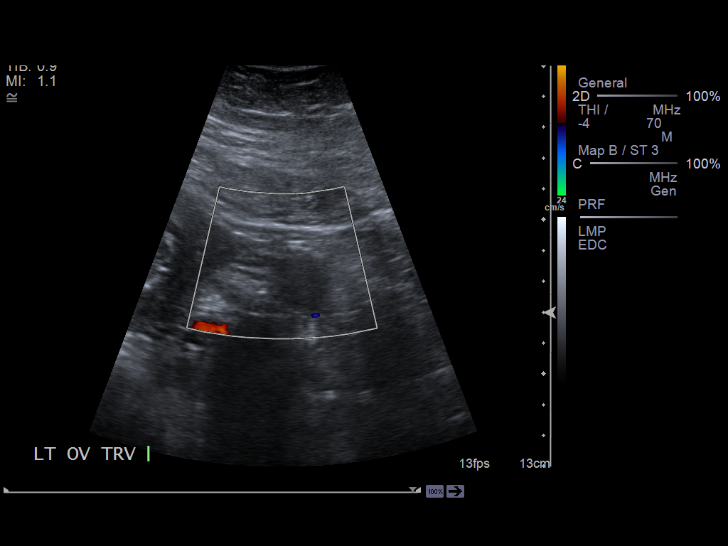
[im 34/37]
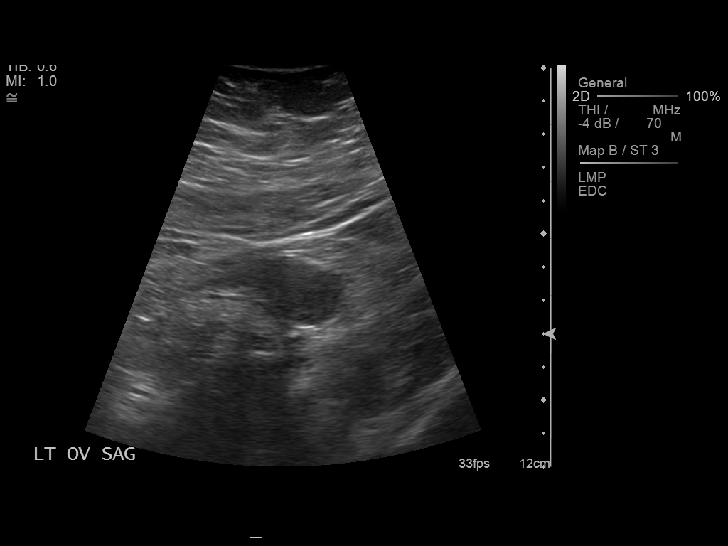
[im 37/37]
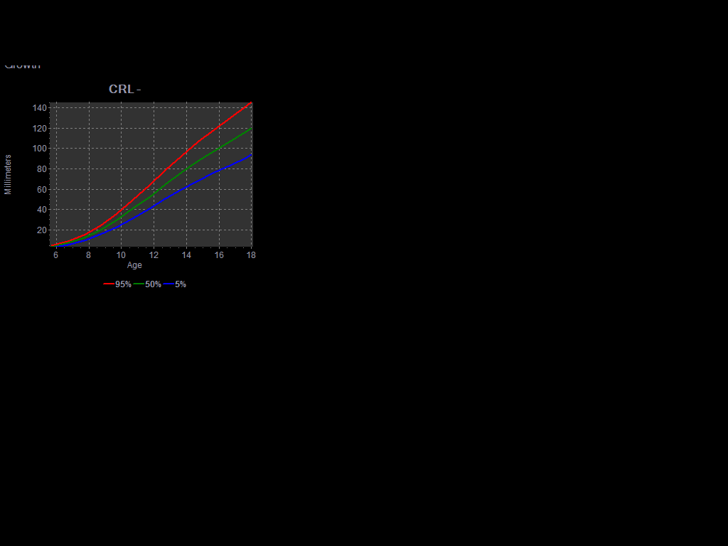

[14 of 28 positions shown; findings below may reference images not displayed]

Intrauterine gestational sac:  Visualized/normal in shape.
Yolk sac: Not seen
Embryo: Seen
Cardiac Activity: Seen
Heart Rate: 158 bpm

CRL: 74.6  mm  13 w  4 d            US EDC: 03/21/2013

Maternal uterus/adnexae:
The left ovary has a normal transabdominal appearance measuring
x 1.8 x 1.8 cm.  The right ovary was not seen with confidence
either transabdominally or endovaginally.  No pelvic fluid is noted
IMPRESSION: Single living intrauterine pregnancy demonstrating an estimated
gestational age by crown-rump length of 13 weeks 4 days.  This is 9
weeks ahead of the expected estimated gestational age by LMP and
correlates with best dating by today's exam.

Normal left ovary and non-visualized right ovary

## 2015-01-11 ENCOUNTER — Emergency Department (HOSPITAL_COMMUNITY): Payer: BLUE CROSS/BLUE SHIELD

## 2015-01-11 ENCOUNTER — Emergency Department (HOSPITAL_COMMUNITY)
Admission: EM | Admit: 2015-01-11 | Discharge: 2015-01-11 | Disposition: A | Discharge status: Home / Self Care | Payer: BLUE CROSS/BLUE SHIELD | Attending: Emergency Medicine | Admitting: Emergency Medicine

## 2015-01-11 DIAGNOSIS — Y9301 Activity, walking, marching and hiking: Secondary | ICD-10-CM | POA: Diagnosis not present

## 2015-01-11 DIAGNOSIS — S99921A Unspecified injury of right foot, initial encounter: Secondary | ICD-10-CM | POA: Diagnosis not present

## 2015-01-11 DIAGNOSIS — S299XXA Unspecified injury of thorax, initial encounter: Secondary | ICD-10-CM | POA: Insufficient documentation

## 2015-01-11 DIAGNOSIS — Y998 Other external cause status: Secondary | ICD-10-CM | POA: Insufficient documentation

## 2015-01-11 DIAGNOSIS — S199XXA Unspecified injury of neck, initial encounter: Secondary | ICD-10-CM | POA: Diagnosis not present

## 2015-01-11 DIAGNOSIS — R42 Dizziness and giddiness: Secondary | ICD-10-CM | POA: Diagnosis not present

## 2015-01-11 DIAGNOSIS — S300XXA Contusion of lower back and pelvis, initial encounter: Secondary | ICD-10-CM | POA: Insufficient documentation

## 2015-01-11 DIAGNOSIS — Z87891 Personal history of nicotine dependence: Secondary | ICD-10-CM | POA: Insufficient documentation

## 2015-01-11 DIAGNOSIS — J45909 Unspecified asthma, uncomplicated: Secondary | ICD-10-CM | POA: Diagnosis not present

## 2015-01-11 DIAGNOSIS — W108XXA Fall (on) (from) other stairs and steps, initial encounter: Secondary | ICD-10-CM | POA: Diagnosis not present

## 2015-01-11 DIAGNOSIS — Z79899 Other long term (current) drug therapy: Secondary | ICD-10-CM | POA: Insufficient documentation

## 2015-01-11 DIAGNOSIS — Z8632 Personal history of gestational diabetes: Secondary | ICD-10-CM | POA: Insufficient documentation

## 2015-01-11 DIAGNOSIS — W19XXXA Unspecified fall, initial encounter: Secondary | ICD-10-CM

## 2015-01-11 DIAGNOSIS — Y9289 Other specified places as the place of occurrence of the external cause: Secondary | ICD-10-CM | POA: Insufficient documentation

## 2015-01-11 DIAGNOSIS — S3992XA Unspecified injury of lower back, initial encounter: Secondary | ICD-10-CM | POA: Diagnosis present

## 2015-01-11 LAB — CBG MONITORING, ED: Glucose-Capillary: 83 mg/dL (ref 65–99)

## 2015-01-11 MED ORDER — CYCLOBENZAPRINE HCL 10 MG PO TABS: 10.0000 mg | ORAL_TABLET | Freq: Two times a day (BID) | ORAL | Status: DC | PRN

## 2015-01-11 MED ORDER — NAPROXEN 500 MG PO TABS: 500.0000 mg | ORAL_TABLET | Freq: Two times a day (BID) | ORAL | Status: DC
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
## 2015-01-11 NOTE — ED Notes (Signed)
Pt reports felt light headed this morning, then says the next thing she remembers was her fiance standing over her at the bottom of the stairs.  Pt c/o pain to r heel and lower back.  Pt says dizziness has improved at this time.  Denies any n/v/d.

## 2015-01-11 NOTE — ED Provider Notes (Signed)
CSN: [id number]     Arrival date & time 01/11/15  1143 History   First MD Initiated Contact with Patient 01/11/15 1314     Chief Complaint  Patient presents with  . Dizziness   HPI The patient presents to the emergency room for evaluation of a fall. Patient was walking downstairs she had on socks but no shoes.  The next thing She remembers is being down at the bottom of the stairs. Her fianc heard her fall and he immediately checked on her.  Patient was awake and alert. He did have dizziness after the fall. No vomiting or diarrhea. Patient complains of pain in her neck and back. She also has pain in her right heel. No numbness or weakness Past Medical History  Diagnosis Date  . Asthma   . Gestational diabetes   . History of HELLP syndrome, currently pregnant   . Pregnant 05/13/2014  . Nausea 05/13/2014   Past Surgical History  Procedure Laterality Date  . Ankle surgery    . Fracture surgery     Family History  Problem Relation Age of Onset  . Heart disease Mother   . Heart disease Father   . Cancer Paternal Grandmother     cervical  . Heart attack Paternal Grandfather    Social History  Substance Use Topics  . Smoking status: Former Games developer  . Smokeless tobacco: Never Used  . Alcohol Use: No   OB History    Gravida Para Term Preterm AB TAB SAB Ectopic Multiple Living   0 0 0 0 0 3     Review of Systems  All other systems reviewed and are negative.     Allergies  Review of patient's allergies indicates no known allergies.  Home Medications   Prior to Admission medications   Medication Sig Start Date End Date Taking? Authorizing Provider  albuterol (PROVENTIL HFA;VENTOLIN HFA) 108 (90 BASE) MCG/ACT inhaler Inhale 2 puffs into the lungs every 6 (six) hours as needed for wheezing or shortness of breath.   Yes Historical Provider, MD  ibuprofen (ADVIL,MOTRIN) 200 MG tablet Take 200 mg by mouth every 6 (six) hours as needed.   Yes Historical Provider, MD   cyclobenzaprine (FLEXERIL) 10 MG tablet Take 1 tablet (10 mg total) by mouth 2 (two) times daily as needed for muscle spasms. 01/11/15   Linwood Dibbles, MD  ibuprofen (ADVIL,MOTRIN) 600 MG tablet Take 1 tablet (600 mg total) by mouth every 6 (six) hours. Patient not taking: Reported on 01/11/2015 11/02/14   Montez Morita, CNM  naproxen (NAPROSYN) 500 MG tablet Take 1 tablet (500 mg total) by mouth 2 (two) times daily. 01/11/15   Linwood Dibbles, MD  oxyCODONE-acetaminophen (PERCOCET/ROXICET) 5-325 MG per tablet Take 1-2 tablets by mouth every 4 (four) hours as needed for moderate pain. Patient not taking: Reported on 01/11/2015 11/02/14   Montez Morita, CNM  prenatal vitamin w/FE, FA (PRENATAL 1 + 1) 27-1 MG TABS tablet Take 1 tablet by mouth daily at 12 noon. Patient not taking: Reported on 01/11/2015 05/13/14   Adline Potter, NP   BP 109/57 mmHg  Pulse 63  Temp(Src) 98.2 F (36.8 C) (Oral)  Resp 16  Ht  (1.676 m)  Wt 248 lb 12.8 oz (112.855 kg)  BMI 40.18 kg/m2  SpO2 100%  LMP 12/14/2014 Physical Exam  Constitutional: She appears well-developed and well-nourished. No distress.  HENT:  Head: Normocephalic and atraumatic.  Right Ear: External ear normal.  Left Ear: External ear normal.  Eyes: Conjunctivae are normal. Right eye exhibits no discharge. Left eye exhibits no discharge. No scleral icterus.  Neck: Neck supple. No tracheal deviation present.  Cardiovascular: Normal rate, regular rhythm and intact distal pulses.   Pulmonary/Chest: Effort normal and breath sounds normal. No stridor. No respiratory distress. She has no wheezes. She has no rales.  Abdominal: Soft. Bowel sounds are normal. She exhibits no distension. There is no tenderness. There is no rebound and no guarding.  Musculoskeletal: She exhibits no edema.       Cervical back: She exhibits tenderness.       Thoracic back: She exhibits tenderness.       Lumbar back: She exhibits tenderness.       Right foot: There is  tenderness and bony tenderness.  Tenderness palpation right heel  Neurological: She is alert. She has normal strength. No cranial nerve deficit (no facial droop, extraocular movements intact, no slurred speech) or sensory deficit. She exhibits normal muscle tone. She displays no seizure activity. Coordination normal.  Skin: Skin is warm and dry. No rash noted.  Psychiatric: She has a normal mood and affect.  Nursing note and vitals reviewed.   ED Course  Procedures (including critical care time) Labs Review Labs Reviewed  CBG MONITORING, ED    Imaging Review Dg Thoracic Spine W/swimmers  01/11/2015   CLINICAL DATA:  24 year old who became acutely lightheaded with brief loss of consciousness at home earlier today causing her to fall down approximately 5-6 steps. Initial encounter.  EXAM: THORACIC SPINE - 3 VIEWS  COMPARISON:  None.  FINDINGS: Twelve rib-bearing thoracic vertebrae with T12 having small, rudimentary ribs. Anatomic alignment. No fractures. Well preserved disc spaces. No evidence of spondylosis. Pedicles intact.  IMPRESSION: Normal examination. (Please note that T12 has small, rudimentary ribs).   Electronically Signed   By: Hulan Saas M.D.   On: 01/11/2015 14:33   Dg Lumbar Spine Complete  01/11/2015   CLINICAL DATA:  Upper and lower back pain. Fell down 5-6 steps at home.  EXAM: LUMBAR SPINE - COMPLETE 4+ VIEW  COMPARISON:  CT 09/28/2013  FINDINGS: Vertebral body alignment, heights and disc space heights are within normal. No evidence of compression fracture or subluxation. Remainder of the exam is within normal.  IMPRESSION: No acute findings.   Electronically Signed   By: Elberta Fortis M.D.   On: 01/11/2015 14:34   Ct Head Wo Contrast  01/11/2015   CLINICAL DATA:  Fall this morning with lightheadedness and dizziness.  EXAM: CT HEAD WITHOUT CONTRAST  CT CERVICAL SPINE WITHOUT CONTRAST  TECHNIQUE: Multidetector CT imaging of the head and cervical spine was performed  following the standard protocol without intravenous contrast. Multiplanar CT image reconstructions of the cervical spine were also generated.  COMPARISON:  None.  FINDINGS: CT HEAD FINDINGS  Ventricles, cisterns and other CSF spaces are within normal. There is no mass, mass effect, shift of midline structures or acute hemorrhage. There is no evidence of acute infarction. Bones soft tissues are normal.  CT CERVICAL SPINE FINDINGS  Vertebral body alignment, heights and disc space heights are normal. Prevertebral soft tissues as well as the atlantoaxial articulation are normal. There is no acute fracture or subluxation. Remainder of the exam is within normal.  IMPRESSION: No acute intracranial findings.  No acute cervical spine injury.   Electronically Signed   By: Elberta Fortis M.D.   On: 01/11/2015 14:50   Ct Cervical Spine Wo Contrast  01/11/2015  CLINICAL DATA:  Fall this morning with lightheadedness and dizziness.  EXAM: CT HEAD WITHOUT CONTRAST  CT CERVICAL SPINE WITHOUT CONTRAST  TECHNIQUE: Multidetector CT imaging of the head and cervical spine was performed following the standard protocol without intravenous contrast. Multiplanar CT image reconstructions of the cervical spine were also generated.  COMPARISON:  None.  FINDINGS: CT HEAD FINDINGS  Ventricles, cisterns and other CSF spaces are within normal. There is no mass, mass effect, shift of midline structures or acute hemorrhage. There is no evidence of acute infarction. Bones soft tissues are normal.  CT CERVICAL SPINE FINDINGS  Vertebral body alignment, heights and disc space heights are normal. Prevertebral soft tissues as well as the atlantoaxial articulation are normal. There is no acute fracture or subluxation. Remainder of the exam is within normal.  IMPRESSION: No acute intracranial findings.  No acute cervical spine injury.   Electronically Signed   By: Elberta Fortis M.D.   On: 01/11/2015 14:50   Dg Foot Complete Right  01/11/2015   CLINICAL  DATA:  Fall down steps with right heel pain, initial encounter  EXAM: RIGHT FOOT COMPLETE - 3+ VIEW  COMPARISON:  None.  FINDINGS: Postsurgical changes are noted in the distal tibia. No acute fracture or dislocation is noted. No significant calcaneal spurs are seen. No soft tissue abnormality is noted.  IMPRESSION: No acute abnormality seen.   Electronically Signed   By: Alcide Clever M.D.   On: 01/11/2015 14:35   I have personally reviewed and evaluated these images and lab results as part of my medical decision-making.   MDM   Final diagnoses:  Fall  Lumbar contusion, initial encounter    No evidence of serious injury associated with the fall.  Consistent with soft tissue injury/strain.  Explained findings to patient and warning signs that should prompt return to the ED.     Linwood Dibbles, MD 01/11/15 1500

## 2015-02-01 IMAGING — US US OB TRANSVAGINAL
1 series · 13 of 23 positions shown · non-contrast
Comparison: none

[Series 1: us ob transvaginal · 13 of 23 slices shown]
[im 1/23]
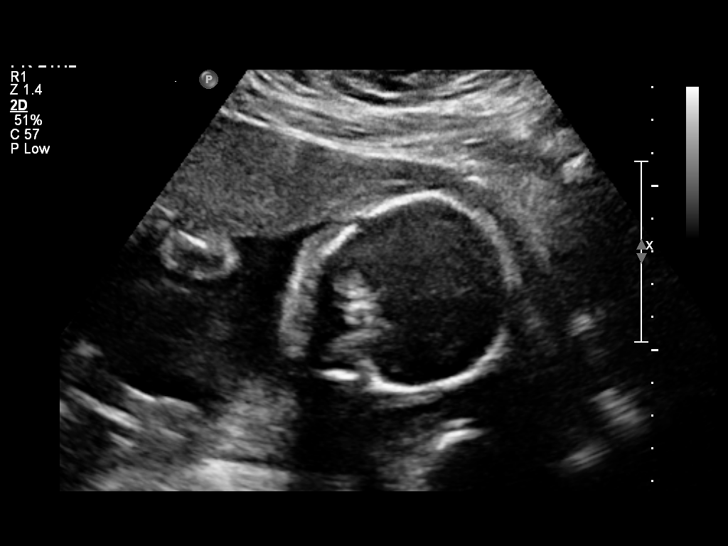
[im 3/23]
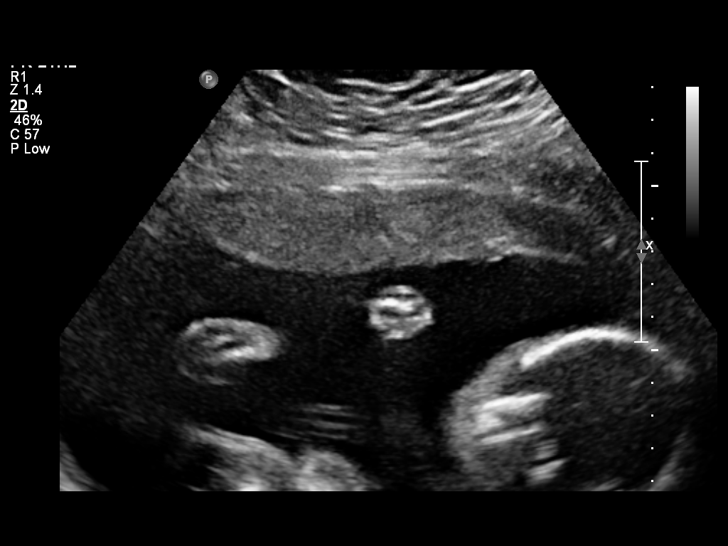
[im 5/23]
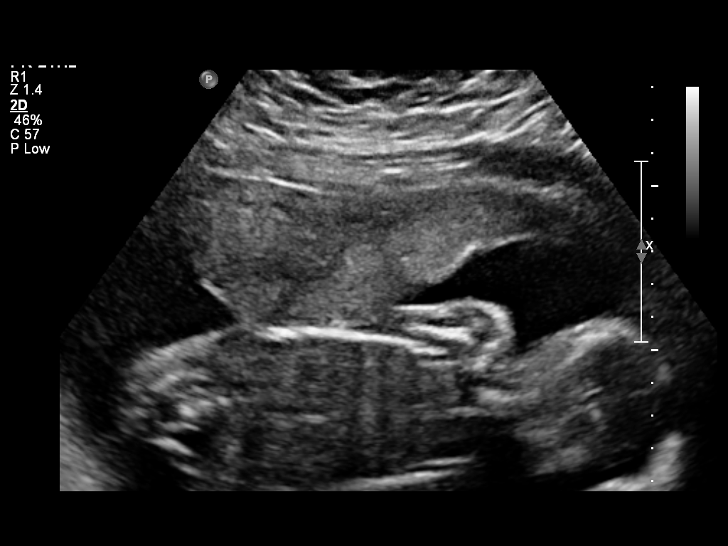
[im 7/23]
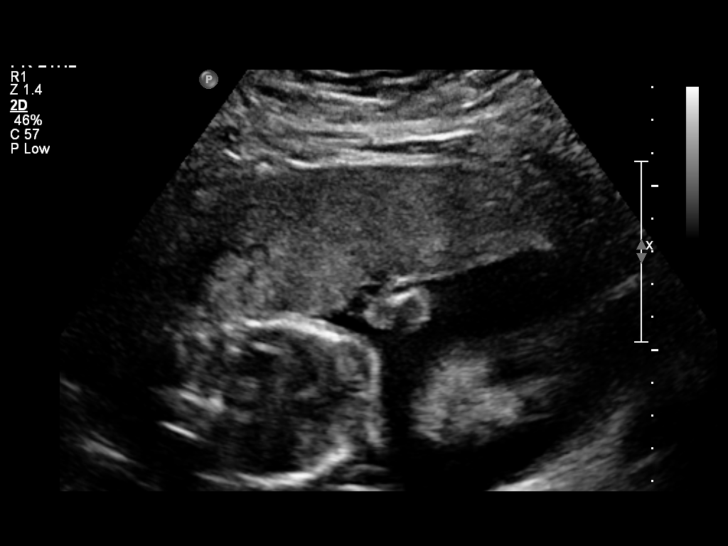
[im 8/23]
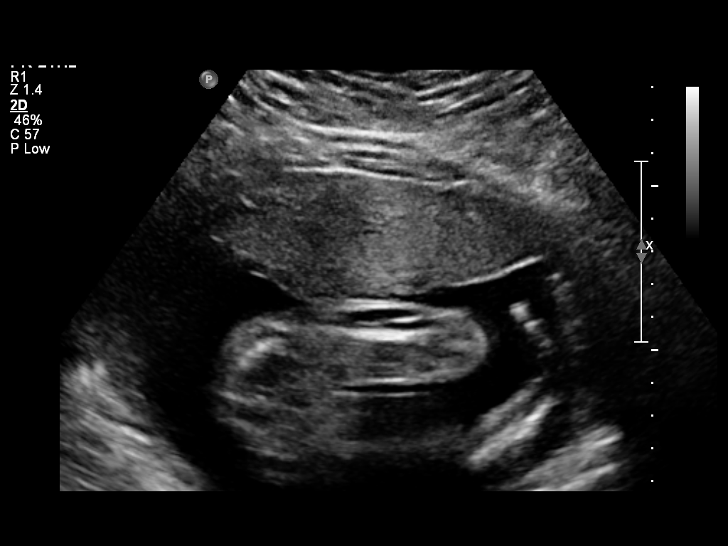
[im 10/23]
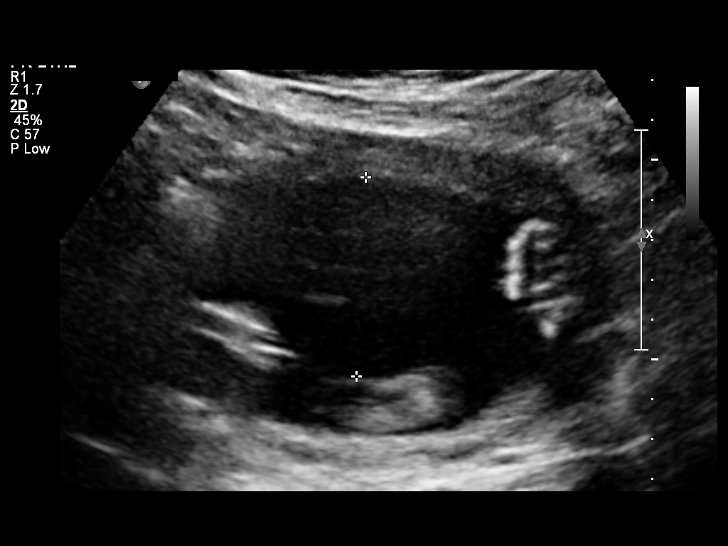
[im 12/23]
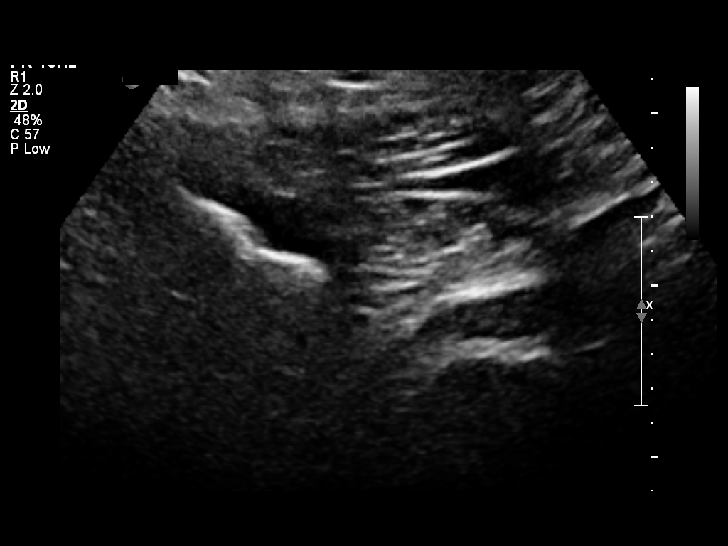
[im 14/23]
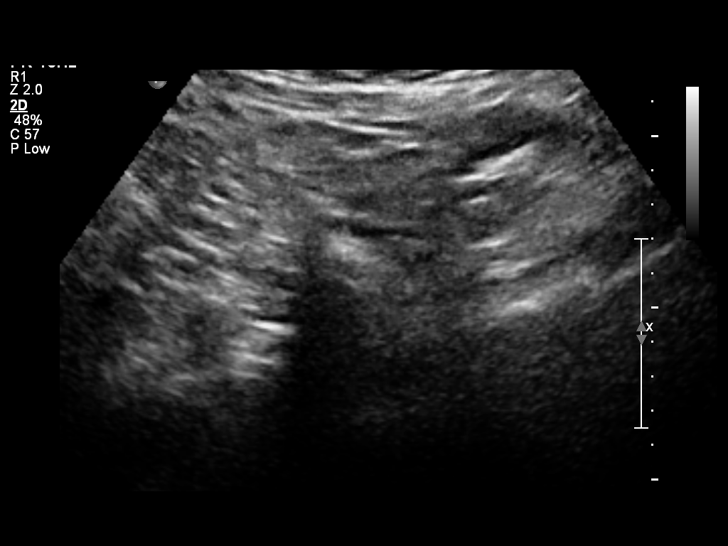
[im 16/23]
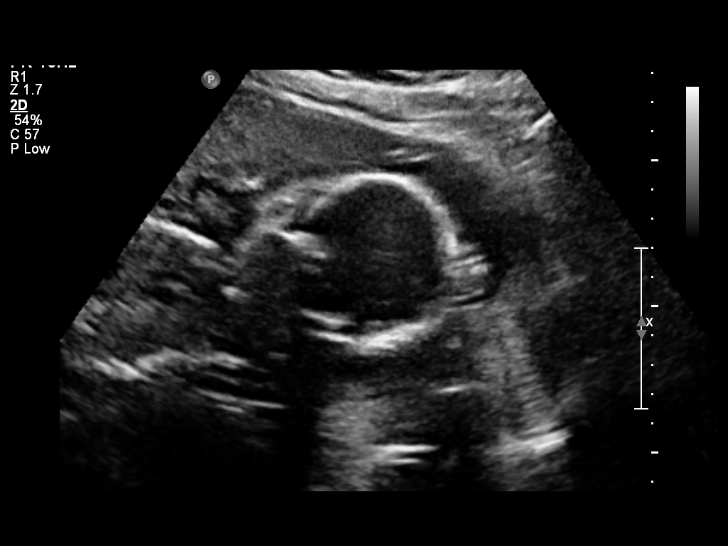
[im 17/23]
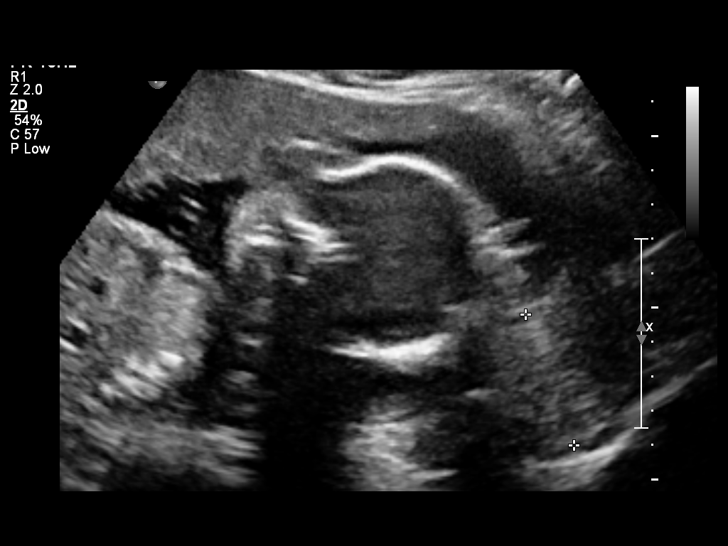
[im 19/23]
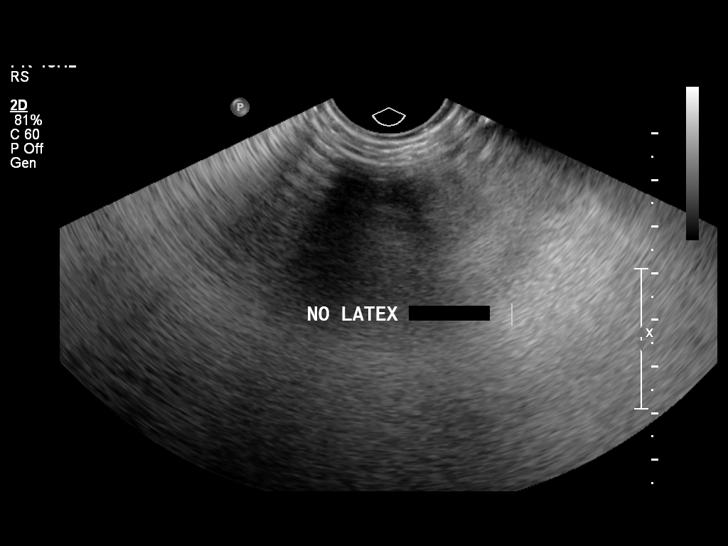
[im 21/23]
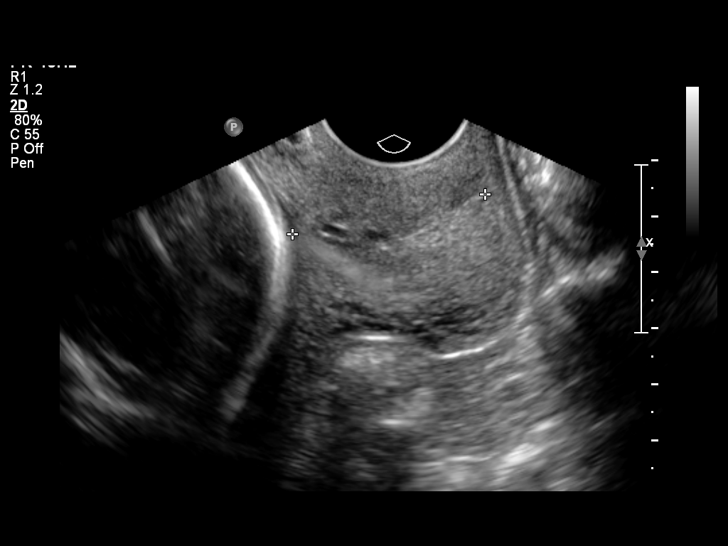
[im 23/23]
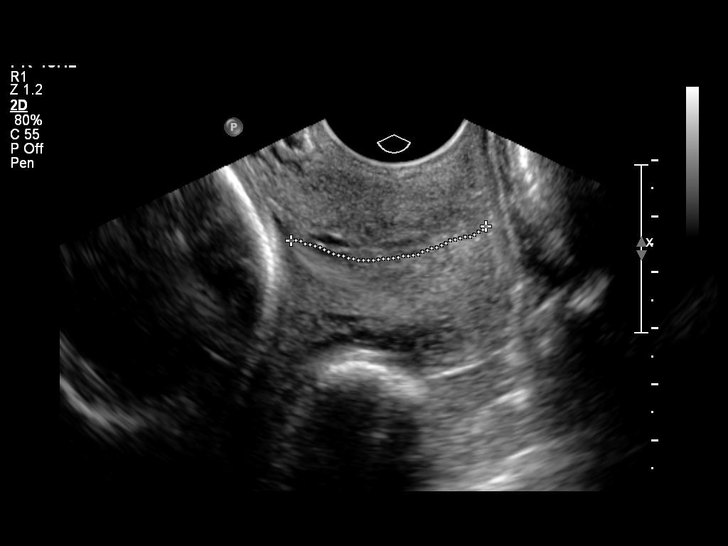

[13 of 23 positions shown; findings below may reference images not displayed]

OBSTETRICS REPORT
                      (Signed Final 12/01/2012 [DATE])

Service(s) Provided

 US OB TRANSVAGINAL                                    76817.0
Indications

 Abdominal/pelvic pain
 Increased BMI                                         649.13,
 History of preterm delivery (36 weeks; HELLP
 syndrome)
 Short interpregnancy interval
 History of gestational diabetes
Fetal Evaluation

 Num Of Fetuses:    1
 Fetal Heart Rate:  135                          bpm
 Cardiac Activity:  Observed
 Presentation:      Cephalic
 Placenta:          Anterior, above cervical os
 P. Cord            Not well visualized
 Insertion:

 Amniotic Fluid
 AFI FV:      Subjectively within normal limits
                                             Larg Pckt:    4.98  cm
Gestational Age

 Clinical EDD:  24w 3d                                        EDD:   03/19/13
 Best:          24w 3d     Det. By:  Clinical EDD             EDD:   03/19/13
Cervix Uterus Adnexa

 Cervical Length:    3.4       cm

 Cervix:       Measured transvaginally. No funnelling or dynamic
               change noted.
 Left Ovary:    Not visualized.
 Right Ovary:   Not visualized.
 Adnexa:     No abnormality visualized.
Comments

 Study limited to evaluation of cervical length.

 Quad Screen indicates no increased risks for fetal
 chromosome anomalies.  MSAFP is also WNL.

 Early screen for gestational diabetes reported to be in
 progress.
Impression

 [place] IUP at 24w 3d
 Normal amniotic fluid volume
 Reassuring cervical length
Recommendations

 If not previously done, recommend ultrasound evaluation of
 fetal anatomy.  Given increased BMI, serial evaluations of
 fetal growth and amniotic fluid volume may be of benefit
 through the third trimester.  Recommend further evaluations
 of cervical length based on patient's clinical course.  Follow
 up early screen for gestational diabetes.  If this is WNL,
 repeat evaluation is recommend at approximately 28 weeks.

 questions or concerns.
                Son, Obara

## 2015-07-16 ENCOUNTER — Encounter (HOSPITAL_COMMUNITY): Payer: Self-pay | Admitting: Emergency Medicine

## 2015-07-16 ENCOUNTER — Emergency Department (HOSPITAL_COMMUNITY): Payer: BLUE CROSS/BLUE SHIELD

## 2015-07-16 ENCOUNTER — Emergency Department (HOSPITAL_COMMUNITY)
Admission: EM | Admit: 2015-07-16 | Discharge: 2015-07-16 | Disposition: A | Discharge status: Home / Self Care | Payer: BLUE CROSS/BLUE SHIELD | Attending: Emergency Medicine | Admitting: Emergency Medicine

## 2015-07-16 DIAGNOSIS — R05 Cough: Secondary | ICD-10-CM | POA: Diagnosis present

## 2015-07-16 DIAGNOSIS — J069 Acute upper respiratory infection, unspecified: Secondary | ICD-10-CM | POA: Insufficient documentation

## 2015-07-16 DIAGNOSIS — Z79899 Other long term (current) drug therapy: Secondary | ICD-10-CM | POA: Diagnosis not present

## 2015-07-16 DIAGNOSIS — Z87891 Personal history of nicotine dependence: Secondary | ICD-10-CM | POA: Insufficient documentation

## 2015-07-16 DIAGNOSIS — J45909 Unspecified asthma, uncomplicated: Secondary | ICD-10-CM | POA: Diagnosis not present

## 2015-07-16 DIAGNOSIS — Z791 Long term (current) use of non-steroidal anti-inflammatories (NSAID): Secondary | ICD-10-CM | POA: Diagnosis not present

## 2015-07-16 DIAGNOSIS — Z87898 Personal history of other specified conditions: Secondary | ICD-10-CM

## 2015-07-16 MED ORDER — PROMETHAZINE-CODEINE 6.25-10 MG/5ML PO SYRP: 5.0000 mL | ORAL_SOLUTION | Freq: Four times a day (QID) | ORAL | Status: DC | PRN

## 2015-07-16 MED ORDER — BENZONATATE 100 MG PO CAPS
200.0000 mg | ORAL_CAPSULE | Freq: Once | ORAL | Status: AC
  Administered 2015-07-16: 200 mg via ORAL
  Filled 2015-07-16: qty 2

## 2015-07-16 MED ORDER — BENZONATATE 100 MG PO CAPS: 200.0000 mg | ORAL_CAPSULE | Freq: Three times a day (TID) | ORAL | Status: DC | PRN

## 2015-07-16 NOTE — Discharge Instructions (Signed)
Upper Respiratory Infection, Adult Most upper respiratory infections (URIs) are a viral infection of the air passages leading to the lungs. A URI affects the nose, throat, and upper air passages. The most common type of URI is nasopharyngitis and is typically referred to as "the common cold." URIs run their course and usually go away on their own. Most of the time, a URI does not require medical attention, but sometimes a bacterial infection in the upper airways can follow a viral infection. This is called a secondary infection. Sinus and middle ear infections are common types of secondary upper respiratory infections. Bacterial pneumonia can also complicate a URI. A URI can worsen asthma and chronic obstructive pulmonary disease (COPD). Sometimes, these complications can require emergency medical care and may be life threatening.  CAUSES Almost all URIs are caused by viruses. A virus is a type of germ and can spread from one person to another.  RISKS FACTORS You may be at risk for a URI if:   You smoke.   You have chronic heart or lung disease.  You have a weakened defense (immune) system.   You are very young or very old.   You have nasal allergies or asthma.  You work in crowded or poorly ventilated areas.  You work in health care facilities or schools. SIGNS AND SYMPTOMS  Symptoms typically develop 2-3 days after you come in contact with a cold virus. Most viral URIs last 7-10 days. However, viral URIs from the influenza virus (flu virus) can last 14-18 days and are typically more severe. Symptoms may include:   Runny or stuffy (congested) nose.   Sneezing.   Cough.   Sore throat.   Headache.   Fatigue.   Fever.   Loss of appetite.   Pain in your forehead, behind your eyes, and over your cheekbones (sinus pain).  Muscle aches.  DIAGNOSIS  Your health care provider may diagnose a URI by:  Physical exam.  Tests to check that your symptoms are not due to  another condition such as:  Strep throat.  Sinusitis.  Pneumonia.  Asthma. TREATMENT  A URI goes away on its own with time. It cannot be cured with medicines, but medicines may be prescribed or recommended to relieve symptoms. Medicines may help:  Reduce your fever.  Reduce your cough.  Relieve nasal congestion. HOME CARE INSTRUCTIONS   Take medicines only as directed by your health care provider.   Gargle warm saltwater or take cough drops to comfort your throat as directed by your health care provider.  Use a warm mist humidifier or inhale steam from a shower to increase air moisture. This may make it easier to breathe.  Drink enough fluid to keep your urine clear or pale yellow.   Eat soups and other clear broths and maintain good nutrition.   Rest as needed.   Return to work when your temperature has returned to normal or as your health care provider advises. You may need to stay home longer to avoid infecting others. You can also use a face mask and careful hand washing to prevent spread of the virus.  Increase the usage of your inhaler if you have asthma.   Do not use any tobacco products, including cigarettes, chewing tobacco, or electronic cigarettes. If you need help quitting, ask your health care provider. PREVENTION  The best way to protect yourself from getting a cold is to practice good hygiene.   Avoid oral or hand contact with people with cold  symptoms.   Wash your hands often if contact occurs.  There is no clear evidence that vitamin C, vitamin E, echinacea, or exercise reduces the chance of developing a cold. However, it is always recommended to get plenty of rest, exercise, and practice good nutrition.  SEEK MEDICAL CARE IF:   You are getting worse rather than better.   Your symptoms are not controlled by medicine.   You have chills.  You have worsening shortness of breath.  You have brown or red mucus.  You have yellow or brown nasal  discharge.  You have pain in your face, especially when you bend forward.  You have a fever.  You have swollen neck glands.  You have pain while swallowing.  You have white areas in the back of your throat. SEEK IMMEDIATE MEDICAL CARE IF:   You have severe or persistent:  Headache.  Ear pain.  Sinus pain.  Chest pain.  You have chronic lung disease and any of the following:  Wheezing.  Prolonged cough.  Coughing up blood.  A change in your usual mucus.  You have a stiff neck.  You have changes in your:  Vision.  Hearing.  Thinking.  Mood. MAKE SURE YOU:   Understand these instructions.  Will watch your condition.  Will get help right away if you are not doing well or get worse.   This information is not intended to replace advice given to you by your health care provider. Make sure you discuss any questions you have with your health care provider.   Document Released: 10/04/2000 Document Revised: 08/25/2014 Document Reviewed: 07/16/2013 Elsevier Interactive Patient Education 2016 [employer]    You may take the narcotic cough syrup prescribed for cough and throat pain relief.  This will make you drowsy - do not drive within 4 hours of taking this medication.

## 2015-07-16 NOTE — ED Notes (Signed)
PT c/o sore throat, nasal congestion, and productive thick sputum cough with thought of fever x1 day. PT denies any medications today.

## 2015-07-16 NOTE — ED Notes (Signed)
Pt with itchy throat yesterday and chills, productive cough of green phlegm per pt

## 2015-07-18 NOTE — ED Provider Notes (Addendum)
CSN: [id number]     Arrival date & time 07/16/15  1045 History   First MD Initiated Contact with Patient 07/16/15 1229     Chief Complaint  Patient presents with  . Cough     (Consider location/radiation/quality/duration/timing/severity/associated sxs/prior Treatment) The history is provided by the patient.   Nicole Moon is a 25 y.o. female presenting with a 1 day history of uri type symptoms which includes nasal congestion with clear rhinorrhea, sore itchy throat, chills without documented fever and cough which has been productive of thick yellow sputum.  Symptoms do not include shortness of breath, chest pain,  Nausea, vomiting or diarrhea.  The patient has taken no medicines for her symptoms prior to arrival.  She reports having intermittent wheezing which has been improved with albuterol mdi, last dose this am.     Past Medical History  Diagnosis Date  . Asthma   . Gestational diabetes   . History of HELLP syndrome, currently pregnant   . Pregnant 05/13/2014  . Nausea 05/13/2014   Past Surgical History  Procedure Laterality Date  . Ankle surgery    . Fracture surgery     Family History  Problem Relation Age of Onset  . Heart disease Mother   . Heart disease Father   . Cancer Paternal Grandmother     cervical  . Heart attack Paternal Grandfather    Social History  Substance Use Topics  . Smoking status: Former Games developer  . Smokeless tobacco: Never Used  . Alcohol Use: No   OB History    Gravida Para Term Preterm AB TAB SAB Ectopic Multiple Living   0 0 0 0 0 3     Review of Systems  Constitutional: Positive for chills. Negative for fever.  HENT: Positive for congestion and sore throat. Negative for ear pain, rhinorrhea, sinus pressure, trouble swallowing and voice change.   Eyes: Negative for discharge.  Respiratory: Positive for cough and wheezing. Negative for shortness of breath and stridor.   Cardiovascular: Negative for chest pain.   Gastrointestinal: Negative for nausea, vomiting and abdominal pain.  Genitourinary: Negative.       Allergies  Review of patient's allergies indicates no known allergies.  Home Medications   Prior to Admission medications   Medication Sig Start Date End Date Taking? Authorizing Provider  albuterol (PROVENTIL HFA;VENTOLIN HFA) 108 (90 BASE) MCG/ACT inhaler Inhale 2 puffs into the lungs every 6 (six) hours as needed for wheezing or shortness of breath.   Yes Historical Provider, MD  ibuprofen (ADVIL,MOTRIN) 200 MG tablet Take 200 mg by mouth every 6 (six) hours as needed.   Yes Historical Provider, MD  benzonatate (TESSALON) 100 MG capsule Take 2 capsules (200 mg total) by mouth 3 (three) times daily as needed. 07/16/15   Burgess Amor, PA-C  promethazine-codeine (PHENERGAN WITH CODEINE) 6.25-10 MG/5ML syrup Take 5 mLs by mouth every 6 (six) hours as needed for cough. 07/16/15   Burgess Amor, PA-C   BP 122/84 mmHg  Pulse 101  Temp(Src) 99.3 F (37.4 C) (Oral)  Resp 18  Ht  (1.676 m)  Wt 112.492 kg  BMI 40.05 kg/m2  SpO2 100%  LMP 05/30/2015 Physical Exam  Constitutional: She is oriented to person, place, and time. She appears well-developed and well-nourished.  HENT:  Head: Normocephalic and atraumatic.  Right Ear: Tympanic membrane and ear canal normal.  Left Ear: Tympanic membrane and ear canal normal.  Nose: Rhinorrhea present. No mucosal edema.  Mouth/Throat:  Uvula is midline, oropharynx is clear and moist and mucous membranes are normal. No oropharyngeal exudate, posterior oropharyngeal edema, posterior oropharyngeal erythema or tonsillar abscesses.  Eyes: Conjunctivae are normal.  Cardiovascular: Normal rate and normal heart sounds.   Pulmonary/Chest: Effort normal. No respiratory distress. She has no wheezes. She has no rales.  Abdominal: Soft. There is no tenderness.  Musculoskeletal: Normal range of motion.  Neurological: She is alert and oriented to person, place, and  time.  Skin: Skin is warm and dry. No rash noted.  Psychiatric: She has a normal mood and affect.    ED Course  Procedures (including critical care time) Labs Review Labs Reviewed - No data to display  Imaging Review Dg Chest 2 View  07/16/2015  CLINICAL DATA:  Chest congestion, cough, fever, and scratchy throat. History of asthma. EXAM: CHEST  2 VIEW COMPARISON:  Thoracic spine radiographs 01/11/2015 FINDINGS: The cardiomediastinal silhouette is within normal limits. The lungs are well inflated. No confluent airspace opacity, edema, pleural effusion, or pneumothorax is identified. No acute osseous abnormality is identified. IMPRESSION: No active cardiopulmonary disease. Electronically Signed   By: Sebastian AcheAllen  Grady M.D.   On: 07/16/2015 11:38   I have personally reviewed and evaluated these images and lab results as part of my medical decision-making.   EKG Interpretation None      MDM   Final diagnoses:  Acute URI  History of wheezing     Radiological studies were viewed, interpreted and considered during the medical decision making and disposition process. I agree with radiologists reading.  Results were also discussed with patient.  Exam and h/o c/w viral uri.  Encouraged continued albuterol q 4 hours prn wheezing,  No wheeze on todays exam.  She was prescribed tessalon, phenergan/codeine for cough sx. Rest, increased fluid intake.  Contrary to PMH listed above, pt not currently pregnant.  LMP 06/30/15 and regular.      Burgess AmorJulie Tenita Cue, PA-C 07/18/15 [id number]  Azalia BilisKevin Campos, MD 07/18/15 0902  Burgess AmorJulie Logan Vegh, PA-C 08/12/15 [phone]  Azalia BilisKevin Campos, MD 08/20/15 [id number]

## 2015-09-22 ENCOUNTER — Encounter (HOSPITAL_COMMUNITY): Payer: Self-pay

## 2015-09-22 ENCOUNTER — Emergency Department (HOSPITAL_COMMUNITY)
Admission: EM | Admit: 2015-09-22 | Discharge: 2015-09-22 | Disposition: A | Discharge status: Home / Self Care | Payer: BLUE CROSS/BLUE SHIELD | Attending: Emergency Medicine | Admitting: Emergency Medicine

## 2015-09-22 DIAGNOSIS — K029 Dental caries, unspecified: Secondary | ICD-10-CM | POA: Diagnosis not present

## 2015-09-22 DIAGNOSIS — Z87891 Personal history of nicotine dependence: Secondary | ICD-10-CM | POA: Insufficient documentation

## 2015-09-22 DIAGNOSIS — J45909 Unspecified asthma, uncomplicated: Secondary | ICD-10-CM | POA: Diagnosis not present

## 2015-09-22 DIAGNOSIS — Z791 Long term (current) use of non-steroidal anti-inflammatories (NSAID): Secondary | ICD-10-CM | POA: Diagnosis not present

## 2015-09-22 DIAGNOSIS — K0889 Other specified disorders of teeth and supporting structures: Secondary | ICD-10-CM

## 2015-09-22 DIAGNOSIS — K047 Periapical abscess without sinus: Secondary | ICD-10-CM

## 2015-09-22 MED ORDER — IBUPROFEN 800 MG PO TABS
800.0000 mg | ORAL_TABLET | Freq: Once | ORAL | Status: AC
Start: 2015-09-22 — End: 2015-09-22
  Administered 2015-09-22: 800 mg via ORAL
  Filled 2015-09-22: qty 1

## 2015-09-22 MED ORDER — AMOXICILLIN 250 MG PO CAPS
500.0000 mg | ORAL_CAPSULE | Freq: Once | ORAL | Status: AC
  Administered 2015-09-22: 500 mg via ORAL
  Filled 2015-09-22: qty 2

## 2015-09-22 MED ORDER — TRAMADOL HCL 50 MG PO TABS: ORAL_TABLET | ORAL | Status: DC

## 2015-09-22 MED ORDER — AMOXICILLIN 500 MG PO CAPS: 500.0000 mg | ORAL_CAPSULE | Freq: Three times a day (TID) | ORAL | Status: DC

## 2015-09-22 NOTE — ED Notes (Signed)
Pt c/o dental pain since yesterday.  Was unable to get in with her dentist today.

## 2015-09-22 NOTE — Discharge Instructions (Signed)
Your vital signs within normal limits. Your examination reveals swelling of the gum, and evidence of infected cavities. Please use Amoxil and 600 mg of ibuprofen 3 times daily with food. May use Ultram for more severe pain. Ultram may cause drowsiness, please use this medication with caution. It is important that she see a dentist as sone as possible concerning these infected areas. Dental Caries Dental caries is tooth decay. This decay can cause a hole in teeth (cavity) that can get bigger and deeper over time. HOME CARE  Brush and floss your teeth. Do this at least two times a day.  Use a fluoride toothpaste.  Use a mouth rinse if told by your dentist or doctor.  Eat less sugary and starchy foods. Drink less sugary drinks.  Avoid snacking often on sugary and starchy foods. Avoid sipping often on sugary drinks.  Keep regular checkups and cleanings with your dentist.  Use fluoride supplements if told by your dentist or doctor.  Allow fluoride to be applied to teeth if told by your dentist or doctor.   This information is not intended to replace advice given to you by your health care provider. Make sure you discuss any questions you have with your health care provider.   Document Released: 01/18/2008 Document Revised: 05/01/2014 Document Reviewed: 04/12/2012 Elsevier Interactive Patient Education [employer]2016 [employer] Inc[employer]

## 2015-09-22 NOTE — ED Provider Notes (Signed)
CSN: [id number]     Arrival date & time 09/22/15  1000 History   First MD Initiated Contact with Patient 09/22/15 1024     Chief Complaint  Patient presents with  . Dental Pain     (Consider location/radiation/quality/duration/timing/severity/associated sxs/prior Treatment) Patient is a 25 y.o. female presenting with tooth pain. The history is provided by the patient.  Dental Pain Location:  Lower Quality:  Aching and throbbing Severity:  Moderate Onset quality:  Gradual Duration:  1 day Timing:  Intermittent Progression:  Worsening Chronicity:  New Context: dental caries   Context: not dental fracture   Relieved by:  Nothing Ineffective treatments:  NSAIDs Associated symptoms: gum swelling and headaches   Associated symptoms: no drooling, no fever and no trismus   Risk factors: lack of dental care   Risk factors: no diabetes and no immunosuppression     Past Medical History  Diagnosis Date  . Asthma   . Gestational diabetes   . History of HELLP syndrome, currently pregnant   . Pregnant 05/13/2014  . Nausea 05/13/2014   Past Surgical History  Procedure Laterality Date  . Ankle surgery    . Fracture surgery     Family History  Problem Relation Age of Onset  . Heart disease Mother   . Heart disease Father   . Cancer Paternal Grandmother     cervical  . Heart attack Paternal Grandfather    Social History  Substance Use Topics  . Smoking status: Former Games developermoker  . Smokeless tobacco: Never Used  . Alcohol Use: No   OB History    Gravida Para Term Preterm AB TAB SAB Ectopic Multiple Living   3 3 1 2  0 0 0 0 0 3     Review of Systems  Constitutional: Negative for fever.  HENT: Negative for drooling.   Neurological: Positive for headaches.  All other systems reviewed and are negative.     Allergies  Review of patient's allergies indicates no known allergies.  Home Medications   Prior to Admission medications   Medication Sig Start Date End Date Taking?  Authorizing Provider  albuterol (PROVENTIL HFA;VENTOLIN HFA) 108 (90 BASE) MCG/ACT inhaler Inhale 2 puffs into the lungs every 6 (six) hours as needed for wheezing or shortness of breath.    Historical Provider, MD  benzonatate (TESSALON) 100 MG capsule Take 2 capsules (200 mg total) by mouth 3 (three) times daily as needed. 07/16/15   Burgess AmorJulie Idol, PA-C  ibuprofen (ADVIL,MOTRIN) 200 MG tablet Take 200 mg by mouth every 6 (six) hours as needed.    Historical Provider, MD  promethazine-codeine (PHENERGAN WITH CODEINE) 6.25-10 MG/5ML syrup Take 5 mLs by mouth every 6 (six) hours as needed for cough. 07/16/15   Burgess AmorJulie Idol, PA-C   BP 128/75 mmHg  Pulse 81  Temp(Src) 98.5 F (36.9 C) (Oral)  Resp 20  Ht 5\' 6"  (1.676 m)  Wt 112.492 kg  BMI 40.05 kg/m2  SpO2 100%  LMP 08/01/2015  Breastfeeding? No Physical Exam  Constitutional: She is oriented to person, place, and time. She appears well-developed and well-nourished.  Non-toxic appearance.  HENT:  Head: Normocephalic.  Right Ear: Tympanic membrane and external ear normal.  Left Ear: Tympanic membrane and external ear normal.  Cavity of the left lower 1st and 3rd molar. Swelling of the gum noted. No swelling under the tongue. Airway patent.  Eyes: EOM and lids are normal. Pupils are equal, round, and reactive to light.  Neck: Normal range of motion.  Neck supple. Carotid bruit is not present.  Cardiovascular: Normal rate, regular rhythm, normal heart sounds, intact distal pulses and normal pulses.   Pulmonary/Chest: Breath sounds normal. No respiratory distress.  Abdominal: Soft. Bowel sounds are normal. There is no tenderness. There is no guarding.  Musculoskeletal: Normal range of motion.  Lymphadenopathy:       Head (right side): No submandibular adenopathy present.       Head (left side): No submandibular adenopathy present.    She has no cervical adenopathy.  Neurological: She is alert and oriented to person, place, and time. She has normal  strength. No cranial nerve deficit or sensory deficit.  Skin: Skin is warm and dry.  Psychiatric: She has a normal mood and affect. Her speech is normal.  Nursing note and vitals reviewed.   ED Course  Procedures (including critical care time) Labs Review Labs Reviewed - No data to display  Imaging Review No results found. I have personally reviewed and evaluated these images and lab results as part of my medical decision-making.   EKG Interpretation None      MDM  Vital signs within normal limits. The examination favors pain related to infected dental caries. The patient has no evidence of Ludwig's angina or any emergent oral condition. The plan at this time is for the patient to be treated with Amoxil, ibuprofen, and Ultram. The patient is strongly encouraged to see a dentist as soon as possible. Wedges were answered. The patient acknowledges understanding of the discharge plan.    Final diagnoses:  Pain, dental  Infected dental caries    **I have reviewed nursing notes, vital signs, and all appropriate lab and imaging results for this patient.Ivery Quale, PA-C 09/22/15 1156  Blane Ohara, MD 09/22/15 [phone]

## 2015-09-22 NOTE — ED Notes (Signed)
Patient complaining of lower left dental pain since yesterday.

## 2015-09-23 ENCOUNTER — Emergency Department (HOSPITAL_COMMUNITY)
Admission: EM | Admit: 2015-09-23 | Discharge: 2015-09-24 | Disposition: A | Discharge status: Home / Self Care | Payer: BLUE CROSS/BLUE SHIELD | Attending: Emergency Medicine | Admitting: Emergency Medicine

## 2015-09-23 ENCOUNTER — Encounter (HOSPITAL_COMMUNITY): Payer: Self-pay | Admitting: Emergency Medicine

## 2015-09-23 DIAGNOSIS — K0889 Other specified disorders of teeth and supporting structures: Secondary | ICD-10-CM | POA: Insufficient documentation

## 2015-09-23 DIAGNOSIS — J45909 Unspecified asthma, uncomplicated: Secondary | ICD-10-CM | POA: Insufficient documentation

## 2015-09-23 DIAGNOSIS — R0602 Shortness of breath: Secondary | ICD-10-CM | POA: Diagnosis not present

## 2015-09-23 DIAGNOSIS — R55 Syncope and collapse: Secondary | ICD-10-CM | POA: Diagnosis not present

## 2015-09-23 DIAGNOSIS — R079 Chest pain, unspecified: Secondary | ICD-10-CM

## 2015-09-23 DIAGNOSIS — Z87891 Personal history of nicotine dependence: Secondary | ICD-10-CM | POA: Insufficient documentation

## 2015-09-23 NOTE — ED Notes (Signed)
Patient states she was seen here 2 days ago for dental pain and was given amoxicillin. States "I think I'm having a reaction to the medicine. I was at work and about an hour ago I started having chest pain and shortness of breath."

## 2015-09-24 ENCOUNTER — Emergency Department (HOSPITAL_COMMUNITY): Payer: BLUE CROSS/BLUE SHIELD

## 2015-09-24 DIAGNOSIS — R079 Chest pain, unspecified: Secondary | ICD-10-CM | POA: Diagnosis not present

## 2015-09-24 NOTE — ED Notes (Signed)
Pt appears anxious, flushed face, complaining discomfort in chest 3/10

## 2015-09-24 NOTE — ED Notes (Signed)
Ambulated pt around nursing station, Pt maintained 100% on room air

## 2015-09-24 NOTE — ED Notes (Signed)
Repeat EKG done and given to Dr Rubin PayorPickering

## 2015-09-24 NOTE — ED Provider Notes (Signed)
CSN: [id number]     Arrival date & time 09/23/15  2331 History   First MD Initiated Contact with Patient 09/24/15 0002     Chief Complaint  Patient presents with  . Chest Pain  . Shortness of Breath      Patient is a 25 y.o. female presenting with chest pain and shortness of breath. The history is provided by the patient.  Chest Pain Associated symptoms: shortness of breath   Associated symptoms: no back pain and no dysphagia   Shortness of Breath Associated symptoms: chest pain   Patient presents with chest pain shortness of breath. Began this evening. States she felt lightheaded at work. States she was recently seen for a dental infections been on antibiotics. States she's had all the antibiotics that she is supposed to. States at work today she began to feel lightheaded. Reportedly was pale. Denies possibility of pregnancy. Last menses was 2 weeks ago. States she does feel somewhat short of breath now. States there is a slight pressure on her chest. No swelling or legs. She has not had episodes like this before.  Past Medical History  Diagnosis Date  . Asthma   . Gestational diabetes   . History of HELLP syndrome, currently pregnant   . Pregnant 05/13/2014  . Nausea 05/13/2014   Past Surgical History  Procedure Laterality Date  . Ankle surgery    . Fracture surgery     Family History  Problem Relation Age of Onset  . Heart disease Mother   . Heart disease Father   . Cancer Paternal Grandmother     cervical  . Heart attack Paternal Grandfather    Social History  Substance Use Topics  . Smoking status: Former Games developermoker  . Smokeless tobacco: Never Used  . Alcohol Use: No   OB History    Gravida Para Term Preterm AB TAB SAB Ectopic Multiple Living   3 3 1 2  0 0 0 0 0 3     Review of Systems  Constitutional: Positive for appetite change.  HENT: Positive for dental problem. Negative for trouble swallowing and voice change.   Respiratory: Positive for shortness of breath.    Cardiovascular: Positive for chest pain.  Musculoskeletal: Negative for back pain.  Skin: Negative for wound.  Neurological: Positive for light-headedness.  Hematological: Negative for adenopathy.      Allergies  Review of patient's allergies indicates no known allergies.  Home Medications   Prior to Admission medications   Medication Sig Start Date End Date Taking? Authorizing Provider  albuterol (PROVENTIL HFA;VENTOLIN HFA) 108 (90 BASE) MCG/ACT inhaler Inhale 2 puffs into the lungs every 6 (six) hours as needed for wheezing or shortness of breath.    Historical Provider, MD  amoxicillin (AMOXIL) 500 MG capsule Take 1 capsule (500 mg total) by mouth 3 (three) times daily. 09/22/15   Ivery QualeHobson Bryant, PA-C  benzonatate (TESSALON) 100 MG capsule Take 2 capsules (200 mg total) by mouth 3 (three) times daily as needed. 07/16/15   Burgess AmorJulie Idol, PA-C  ibuprofen (ADVIL,MOTRIN) 200 MG tablet Take 200 mg by mouth every 6 (six) hours as needed.    Historical Provider, MD  promethazine-codeine (PHENERGAN WITH CODEINE) 6.25-10 MG/5ML syrup Take 5 mLs by mouth every 6 (six) hours as needed for cough. 07/16/15   Burgess AmorJulie Idol, PA-C  traMADol (ULTRAM) 50 MG tablet 1 or 2 po q6h prn with food 09/22/15   Ivery QualeHobson Bryant, PA-C   BP 116/74 mmHg  Pulse 64  Temp(Src) 99.3  F (37.4 C) (Oral)  Resp 10  Ht  (1.676 m)  Wt 242 lb (109.77 kg)  BMI 39.08 kg/m2  SpO2 100%  LMP 09/02/2015 Physical Exam  Constitutional: She appears well-nourished.  HENT:  2 cavities on left lower posterior teeth with some slight swelling of the jaw. No swelling of the floor the mouth.  Neck: Neck supple.  Cardiovascular: Normal rate.   Pulmonary/Chest:  Mild tachypnea.  Abdominal: Soft.  Musculoskeletal: She exhibits no edema.  Neurological: She is alert.  Psychiatric:  Patient appears somewhat anxious.    ED Course  Procedures (including critical care time) Labs Review Labs Reviewed - No data to display  Imaging  Review Dg Chest 2 View  09/24/2015  CLINICAL DATA:  Shortness of breath, chest discomfort and fever. EXAM: CHEST  2 VIEW COMPARISON:  07/16/2015 FINDINGS: The cardiomediastinal contours are normal. The lungs are clear. Pulmonary vasculature is normal. No consolidation, pleural effusion, or pneumothorax. No acute osseous abnormalities are seen. IMPRESSION: No acute pulmonary process. Electronically Signed   By: Rubye Oaks M.D.   On: 09/24/2015 00:43   I have personally reviewed and evaluated these images and lab results as part of my medical decision-making.   EKG Interpretation   Date/Time:  Friday September 24 2015 01:09:16 EDT Ventricular Rate:  62 PR Interval:  135 QRS Duration: 100 QT Interval:  425 QTC Calculation: 432 R Axis:   69 Text Interpretation:  Sinus rhythm Confirmed by Rubin Payor  MD, Harrold Donath  [phone]) on 09/24/2015 1:52:51 AM      MDM   Final diagnoses:  Near syncope  Chest pain, unspecified chest pain type    Patient with chest pain shortness of breath. Near-syncope. EKG and x-ray reassuring. Not hypoxic. Doubt pulmonary embolism. May had a component of anxiety. Feels better after treatment. Will discharge home.    Benjiman Core, MD 09/24/15 [phone]

## 2015-09-24 NOTE — Discharge Instructions (Signed)
°Near-Syncope °Near-syncope (commonly known as near fainting) is sudden weakness, dizziness, or feeling like you might pass out. During an episode of near-syncope, you may also develop pale skin, have tunnel vision, or feel sick to your stomach (nauseous). Near-syncope may occur when getting up after sitting or while standing for a long time. It is caused by a sudden decrease in blood flow to the brain. This decrease can result from various causes or triggers, most of which are not serious. However, because near-syncope can sometimes be a sign of something serious, a medical evaluation is required. The specific cause is often not determined. °HOME CARE INSTRUCTIONS  °Monitor your condition for any changes. The following actions may help to alleviate any discomfort you are experiencing: °· Have someone stay with you until you feel stable. °· Lie down right away and prop your feet up if you start feeling like you might faint. Breathe deeply and steadily. Wait until all the symptoms have passed. Most of these episodes last only a few minutes. You may feel tired for several hours.   °· Drink enough fluids to keep your urine clear or pale yellow.   °· If you are taking blood pressure or heart medicine, get up slowly when seated or lying down. Take several minutes to sit and then stand. This can reduce dizziness. °· Follow up with your health care provider as directed.  °SEEK IMMEDIATE MEDICAL CARE IF:  °· You have a severe headache.   °· You have unusual pain in the chest, abdomen, or back.   °· You are bleeding from the mouth or rectum, or you have black or tarry stool.   °· You have an irregular or very fast heartbeat.   °· You have repeated fainting or have seizure-like jerking during an episode.   °· You faint when sitting or lying down.   °· You have confusion.   °· You have difficulty walking.   °· You have severe weakness.   °· You have vision problems.   °MAKE SURE YOU:  °· Understand these instructions. °· Will  watch your condition. °· Will get help right away if you are not doing well or get worse. °  °This information is not intended to replace advice given to you by your health care provider. Make sure you discuss any questions you have with your health care provider. °  °Document Released: 04/10/2005 Document Revised: 04/15/2013 Document Reviewed: 09/13/2012 °Elsevier Interactive Patient Education ©2016 [employer] ° °Nonspecific Chest Pain  °Chest pain can be caused by many different conditions. There is always a chance that your pain could be related to something serious, such as a heart attack or a blood clot in your lungs. Chest pain can also be caused by conditions that are not life-threatening. If you have chest pain, it is very important to follow up with your health care provider. °CAUSES  °Chest pain can be caused by: °· Heartburn. °· Pneumonia or bronchitis. °· Anxiety or stress. °· Inflammation around your heart (pericarditis) or lung (pleuritis or pleurisy). °· A blood clot in your lung. °· A collapsed lung (pneumothorax). It can develop suddenly on its own (spontaneous pneumothorax) or from trauma to the chest. °· Shingles infection (varicella-zoster virus). °· Heart attack. °· Damage to the bones, muscles, and cartilage that make up your chest wall. This can include: °¨ Bruised bones due to injury. °¨ Strained muscles or cartilage due to frequent or repeated coughing or overwork. °¨ Fracture to one or more ribs. °¨ Sore cartilage due to inflammation (costochondritis). °RISK FACTORS  °Risk factors for   chest pain may include: °· Activities that increase your risk for trauma or injury to your chest. °· Respiratory infections or conditions that cause frequent coughing. °· Medical conditions or overeating that can cause heartburn. °· Heart disease or family history of heart disease. °· Conditions or health behaviors that increase your risk of developing a blood clot. °· Having had chicken pox (varicella  zoster). °SIGNS AND SYMPTOMS °Chest pain can feel like: °· Burning or tingling on the surface of your chest or deep in your chest. °· Crushing, pressure, aching, or squeezing pain. °· Dull or sharp pain that is worse when you move, cough, or take a deep breath. °· Pain that is also felt in your back, neck, shoulder, or arm, or pain that spreads to any of these areas. °Your chest pain may come and go, or it may stay constant. °DIAGNOSIS °Lab tests or other studies may be needed to find the cause of your pain. Your health care provider may have you take a test called an ambulatory ECG (electrocardiogram). An ECG records your heartbeat patterns at the time the test is performed. You may also have other tests, such as: °· Transthoracic echocardiogram (TTE). During echocardiography, sound waves are used to create a picture of all of the heart structures and to look at how blood flows through your heart. °· Transesophageal echocardiogram (TEE). This is a more advanced imaging test that obtains images from inside your body. It allows your health care provider to see your heart in finer detail. °· Cardiac monitoring. This allows your health care provider to monitor your heart rate and rhythm in real time. °· Holter monitor. This is a portable device that records your heartbeat and can help to diagnose abnormal heartbeats. It allows your health care provider to track your heart activity for several days, if needed. °· Stress tests. These can be done through exercise or by taking medicine that makes your heart beat more quickly. °· Blood tests. °· Imaging tests. °TREATMENT  °Your treatment depends on what is causing your chest pain. Treatment may include: °· Medicines. These may include: °¨ Acid blockers for heartburn. °¨ Anti-inflammatory medicine. °¨ Pain medicine for inflammatory conditions. °¨ Antibiotic medicine, if an infection is present. °¨ Medicines to dissolve blood clots. °¨ Medicines to treat coronary artery  disease. °· Supportive care for conditions that do not require medicines. This may include: °¨ Resting. °¨ Applying heat or cold packs to injured areas. °¨ Limiting activities until pain decreases. °HOME CARE INSTRUCTIONS °· If you were prescribed an antibiotic medicine, finish it all even if you start to feel better. °· Avoid any activities that bring on chest pain. °· Do not use any tobacco products, including cigarettes, chewing tobacco, or electronic cigarettes. If you need help quitting, ask your health care provider. °· Do not drink alcohol. °· Take medicines only as directed by your health care provider. °· Keep all follow-up visits as directed by your health care provider. This is important. This includes any further testing if your chest pain does not go away. °· If heartburn is the cause for your chest pain, you may be told to keep your head raised (elevated) while sleeping. This reduces the chance that acid will go from your stomach into your esophagus. °· Make lifestyle changes as directed by your health care provider. These may include: °¨ Getting regular exercise. Ask your health care provider to suggest some activities that are safe for you. °¨ Eating a heart-healthy diet. A registered dietitian can   help you to learn healthy eating options. °¨ Maintaining a healthy weight. °¨ Managing diabetes, if necessary. °¨ Reducing stress. °SEEK MEDICAL CARE IF: °· Your chest pain does not go away after treatment. °· You have a rash with blisters on your chest. °· You have a fever. °SEEK IMMEDIATE MEDICAL CARE IF:  °· Your chest pain is worse. °· You have an increasing cough, or you cough up blood. °· You have severe abdominal pain. °· You have severe weakness. °· You faint. °· You have chills. °· You have sudden, unexplained chest discomfort. °· You have sudden, unexplained discomfort in your arms, back, neck, or jaw. °· You have shortness of breath at any time. °· You suddenly start to sweat, or your skin gets  clammy. °· You feel nauseous or you vomit. °· You suddenly feel light-headed or dizzy. °· Your heart begins to beat quickly, or it feels like it is skipping beats. °These symptoms may represent a serious problem that is an emergency. Do not wait to see if the symptoms will go away. Get medical help right away. Call your local emergency services (911 in the [place] Do not drive yourself to the hospital. °  °This information is not intended to replace advice given to you by your health care provider. Make sure you discuss any questions you have with your health care provider. °  °Document Released: 01/18/2005 Document Revised: 05/01/2014 Document Reviewed: 11/14/2013 °Elsevier Interactive Patient Education ©2016 [employer] Inc. ° ° °

## 2015-09-24 NOTE — ED Notes (Signed)
Patient ambulated around nurses station with pulse ox at 100% no issues while walking

## 2015-12-06 ENCOUNTER — Encounter (HOSPITAL_COMMUNITY): Payer: Self-pay | Admitting: Emergency Medicine

## 2015-12-06 ENCOUNTER — Encounter (HOSPITAL_COMMUNITY): Payer: Self-pay | Admitting: *Deleted

## 2015-12-06 ENCOUNTER — Emergency Department (HOSPITAL_COMMUNITY)
Admission: EM | Admit: 2015-12-06 | Discharge: 2015-12-07 | Disposition: A | Discharge status: Home / Self Care | Payer: BLUE CROSS/BLUE SHIELD | Source: Home / Self Care | Attending: Emergency Medicine | Admitting: Emergency Medicine

## 2015-12-06 ENCOUNTER — Emergency Department (HOSPITAL_COMMUNITY)
Admission: EM | Admit: 2015-12-06 | Discharge: 2015-12-06 | Disposition: A | Discharge status: Home / Self Care | Payer: BLUE CROSS/BLUE SHIELD | Attending: Dermatology | Admitting: Dermatology

## 2015-12-06 DIAGNOSIS — W268XXA Contact with other sharp object(s), not elsewhere classified, initial encounter: Secondary | ICD-10-CM

## 2015-12-06 DIAGNOSIS — F17210 Nicotine dependence, cigarettes, uncomplicated: Secondary | ICD-10-CM | POA: Insufficient documentation

## 2015-12-06 DIAGNOSIS — S61412A Laceration without foreign body of left hand, initial encounter: Secondary | ICD-10-CM | POA: Insufficient documentation

## 2015-12-06 DIAGNOSIS — J45909 Unspecified asthma, uncomplicated: Secondary | ICD-10-CM | POA: Diagnosis not present

## 2015-12-06 DIAGNOSIS — Z79899 Other long term (current) drug therapy: Secondary | ICD-10-CM

## 2015-12-06 DIAGNOSIS — Y939 Activity, unspecified: Secondary | ICD-10-CM | POA: Insufficient documentation

## 2015-12-06 DIAGNOSIS — Y929 Unspecified place or not applicable: Secondary | ICD-10-CM | POA: Insufficient documentation

## 2015-12-06 DIAGNOSIS — Z5321 Procedure and treatment not carried out due to patient leaving prior to being seen by health care provider: Secondary | ICD-10-CM | POA: Diagnosis not present

## 2015-12-06 DIAGNOSIS — Y999 Unspecified external cause status: Secondary | ICD-10-CM | POA: Insufficient documentation

## 2015-12-06 DIAGNOSIS — S61213A Laceration without foreign body of left middle finger without damage to nail, initial encounter: Secondary | ICD-10-CM | POA: Insufficient documentation

## 2015-12-06 DIAGNOSIS — Y990 Civilian activity done for income or pay: Secondary | ICD-10-CM | POA: Insufficient documentation

## 2015-12-06 DIAGNOSIS — Z791 Long term (current) use of non-steroidal anti-inflammatories (NSAID): Secondary | ICD-10-CM | POA: Insufficient documentation

## 2015-12-06 DIAGNOSIS — IMO0002 Reserved for concepts with insufficient information to code with codable children: Secondary | ICD-10-CM

## 2015-12-06 NOTE — ED Notes (Signed)
Called patient from waiting room with no answer.  

## 2015-12-06 NOTE — ED Notes (Signed)
Called patient to room, no answer.  

## 2015-12-06 NOTE — ED Notes (Signed)
Called patient, no answer 

## 2015-12-06 NOTE — ED Triage Notes (Signed)
PT stated she hit her left hand on a metal shelf and obtained a laceration to top of left hand with bleeding controlled at this time.

## 2015-12-06 NOTE — ED Triage Notes (Signed)
Pt states she was at work and hit top of her left hand on metal part of stove; pt has small laceration to middle knuckle of left hand; no bleeding at this time

## 2015-12-07 DIAGNOSIS — S61412A Laceration without foreign body of left hand, initial encounter: Secondary | ICD-10-CM | POA: Diagnosis not present

## 2015-12-07 MED ORDER — LIDOCAINE-EPINEPHRINE (PF) 1 %-1:200000 IJ SOLN
10.0000 mL | Freq: Once | INTRAMUSCULAR | Status: AC
  Administered 2015-12-07: 10 mL
  Filled 2015-12-07: qty 10

## 2015-12-07 MED ORDER — LIDOCAINE-EPINEPHRINE (PF) 1 %-1:200000 IJ SOLN
INTRAMUSCULAR | Status: AC
  Administered 2015-12-07: 01:00:00
  Filled 2015-12-07: qty 30

## 2015-12-07 NOTE — ED Provider Notes (Signed)
[facility] Provider Note   CSN: [id number] Arrival date & time: 12/06/15  2315  By signing my name below, I, Vista Minkobert Ross, attest that this documentation has been prepared under the direction and in the presence of Shon Batonourtney F Danique Hartsough, MD. Electronically signed, Vista Minkobert Ross, ED Scribe. 12/07/15. 1:15 AM.  History   Chief Complaint Chief Complaint  Patient presents with  . Laceration    HPI HPI Comments: Nicole Moon is a 25 y.o. female who presents to the Emergency Department with an injury to her left hand that occurred earlier today while she was at work. Pt came to ED earlier today and states she waited for 3 hours and decided to return to work. Injury occurred at approximately 2pm.  Pt has returned for evaluation. Pt states she hit her left hand on the corner of an oven. Pt has small laceration to left hand; bleeding controlled. Pt reports a throbbing pain immediately s/p but went home and took ibuprofen and reports significant relief of pain. Pt states that she has some mild pain now as she believes the ibuprofen has worn off. Pt denies any drainage from the wound. NKDA    The history is provided by the patient. No language interpreter was used.    Past Medical History:  Diagnosis Date  . Asthma   . Gestational diabetes   . History of HELLP syndrome, currently pregnant   . Nausea 05/13/2014  . Pregnant 05/13/2014    Patient Active Problem List   Diagnosis Date Noted  . Amniotic fluid leaking 10/31/2014  . Gestational diabetes mellitus, class A1 09/07/2014  . History of gestational diabetes in prior pregnancy, currently pregnant 08/04/2014  . Susceptible to varicella (non-immune), currently pregnant 08/04/2014  . Rubella non-immune status, antepartum 08/04/2014  . Rh negative state in antepartum period 08/04/2014  . Supervision of normal pregnancy 05/13/2014  . Nausea 05/13/2014  . Headache 10/04/2012  . History of HELLP syndrome, currently pregnant 10/04/2012     Past Surgical History:  Procedure Laterality Date  . ANKLE SURGERY    . FRACTURE SURGERY      OB History    Gravida Para Term Preterm AB Living   3 3 1 2  0 3   SAB TAB Ectopic Multiple Live Births   0 0 0 0 3       Home Medications    Prior to Admission medications   Medication Sig Start Date End Date Taking? Authorizing Provider  albuterol (PROVENTIL HFA;VENTOLIN HFA) 108 (90 BASE) MCG/ACT inhaler Inhale 2 puffs into the lungs every 6 (six) hours as needed for wheezing or shortness of breath.    Historical Provider, MD  amoxicillin (AMOXIL) 500 MG capsule Take 1 capsule (500 mg total) by mouth 3 (three) times daily. 09/22/15   Ivery QualeHobson Bryant, PA-C  benzonatate (TESSALON) 100 MG capsule Take 2 capsules (200 mg total) by mouth 3 (three) times daily as needed. 07/16/15   Burgess AmorJulie Idol, PA-C  ibuprofen (ADVIL,MOTRIN) 200 MG tablet Take 200 mg by mouth every 6 (six) hours as needed.    Historical Provider, MD  promethazine-codeine (PHENERGAN WITH CODEINE) 6.25-10 MG/5ML syrup Take 5 mLs by mouth every 6 (six) hours as needed for cough. 07/16/15   Burgess AmorJulie Idol, PA-C  traMADol Janean Sark(ULTRAM) 50 MG tablet 1 or 2 po q6h prn with food 09/22/15   Ivery QualeHobson Bryant, PA-C    Family History Family History  Problem Relation Age of Onset  . Heart disease Mother   . Heart disease Father   .  Cancer Paternal Grandmother     cervical  . Heart attack Paternal Grandfather     Social History Social History  Substance Use Topics  . Smoking status: Current Every Day Smoker    Packs/day: 0.25    Types: Cigarettes  . Smokeless tobacco: Never Used  . Alcohol use No     Allergies   Review of patient's allergies indicates no known allergies.   Review of Systems Review of Systems  Skin: Positive for wound (left hand).  Neurological: Negative for weakness and numbness.  All other systems reviewed and are negative.    Physical Exam Updated Vital Signs BP (!) 101/49 (BP Location: Right Arm)   Pulse  90   Temp 98.1 F (36.7 C) (Oral)   Resp 18   Ht 5\' 6"  (1.676 m)   Wt 242 lb (109.8 kg)   LMP 11/07/2015   SpO2 100%   BMI 39.06 kg/m   Physical Exam  Constitutional: She is oriented to person, place, and time. She appears well-developed and well-nourished.  Overweight  HENT:  Head: Normocephalic and atraumatic.  Cardiovascular: Normal rate and regular rhythm.   Pulmonary/Chest: Effort normal. No respiratory distress.  Neurological: She is alert and oriented to person, place, and time.  Skin: Skin is warm and dry.  2 cm laceration to the left MCP, bleeding control, mildly gaping, no significant erythema noted  Psychiatric: She has a normal mood and affect.  Nursing note and vitals reviewed.    ED Treatments / Results  DIAGNOSTIC STUDIES: Oxygen Saturation is 100*% on RA, normal by my interpretation.  COORDINATION OF CARE: 12:21 AM-Will suture. Discussed treatment plan with pt at bedside and pt agreed to plan.   Labs (all labs ordered are listed, but only abnormal results are displayed) Labs Reviewed - No data to display  EKG  EKG Interpretation None       Radiology No results found.  Procedures Procedures  LACERATION REPAIR Performed by: Shon BatonHORTON, Phillips Goulette F Authorized by: Shon BatonHORTON, Laiylah Roettger F Consent: Verbal consent obtained. Risks and benefits: risks, benefits and alternatives were discussed Consent given by: patient Patient identity confirmed: provided demographic data Prepped and Draped in normal sterile fashion Wound explored  Laceration Location: left 3rd MCP  Laceration Length: 2cm  No Foreign Bodies seen or palpated  Anesthesia: local infiltration  Local anesthetic: lidocaine 1% w epinephrine  Anesthetic total: 1 ml  Irrigation method: syringe Amount of cleaning: standard  Skin closure: 5-0 Ethilon  Number of sutures: 1  Technique: interrupted  Patient tolerance: Patient tolerated the procedure well with no immediate  complications.  Medications Ordered in ED Medications  lidocaine-EPINEPHrine (XYLOCAINE-EPINEPHrine) 1 %-1:200000 (PF) injection 10 mL (10 mLs Other Given 12/07/15 0055)  lidocaine-EPINEPHrine (XYLOCAINE-EPINEPHrine) 1 %-1:200000 (PF) injection (  Given 12/07/15 0053)     Initial Impression / Assessment and Plan / ED Course  I have reviewed the triage vital signs and the nursing notes.  Pertinent labs & imaging results that were available during my care of the patient were reviewed by me and considered in my medical decision making (see chart for details).  Clinical Course    Patient presents with laceration to the left MCP. Occurred approximately 10 hours ago. No obvious signs of infection. Wound is gaping but not bleeding. We will place 1 stitch to approximate the wound given that it is a delayed presentation. Suture removal in 7 days.  After history, exam, and medical workup I feel the patient has been appropriately medically screened and is  safe for discharge home. Pertinent diagnoses were discussed with the patient. Patient was given return precautions.   Final Clinical Impressions(s) / ED Diagnoses   Final diagnoses:  Laceration    New Prescriptions New Prescriptions   No medications on file   I personally performed the services described in this documentation, which was scribed in my presence. The recorded information has been reviewed and is accurate.     Shon Baton, MD 12/07/15 [id number]

## 2016-03-29 ENCOUNTER — Encounter (HOSPITAL_COMMUNITY): Payer: Self-pay | Admitting: Emergency Medicine

## 2016-03-29 ENCOUNTER — Emergency Department (HOSPITAL_COMMUNITY)
Admission: EM | Admit: 2016-03-29 | Discharge: 2016-03-29 | Disposition: A | Discharge status: Home / Self Care | Payer: BLUE CROSS/BLUE SHIELD | Attending: Emergency Medicine | Admitting: Emergency Medicine

## 2016-03-29 DIAGNOSIS — F17210 Nicotine dependence, cigarettes, uncomplicated: Secondary | ICD-10-CM | POA: Diagnosis not present

## 2016-03-29 DIAGNOSIS — Z79899 Other long term (current) drug therapy: Secondary | ICD-10-CM | POA: Insufficient documentation

## 2016-03-29 DIAGNOSIS — J45909 Unspecified asthma, uncomplicated: Secondary | ICD-10-CM | POA: Insufficient documentation

## 2016-03-29 DIAGNOSIS — K029 Dental caries, unspecified: Secondary | ICD-10-CM | POA: Diagnosis not present

## 2016-03-29 DIAGNOSIS — K0889 Other specified disorders of teeth and supporting structures: Secondary | ICD-10-CM

## 2016-03-29 MED ORDER — DICLOFENAC SODIUM 75 MG PO TBEC: 75.0000 mg | DELAYED_RELEASE_TABLET | Freq: Two times a day (BID) | ORAL | 0 refills | Status: DC

## 2016-03-29 MED ORDER — AMOXICILLIN 500 MG PO CAPS: 500.0000 mg | ORAL_CAPSULE | Freq: Three times a day (TID) | ORAL | 0 refills | Status: DC

## 2016-03-29 NOTE — ED Provider Notes (Signed)
[facility] Provider Note   CSN: [id number] Arrival date & time: 03/29/16  1736     History   Chief Complaint Chief Complaint  Patient presents with  . Dental Pain    HPI Nicole Moon is a 25 y.o. female.  HPI   Nicole Moon is a 25 y.o. female who presents to the Emergency Department complaining of Dental pain for one day. She describes a throbbing sensation to her left upper tooth. She states that a piece of the tooth has recently broken. Pain is worse with chewing or sensitivities to heat or cold. She's tried over-the-counter analgesics without relief. She denies neck pain, facial pain or swelling, fever or chills.   Past Medical History:  Diagnosis Date  . Asthma   . Gestational diabetes   . History of HELLP syndrome, currently pregnant   . Nausea 05/13/2014  . Pregnant 05/13/2014    Patient Active Problem List   Diagnosis Date Noted  . Amniotic fluid leaking 10/31/2014  . Gestational diabetes mellitus, class A1 09/07/2014  . History of gestational diabetes in prior pregnancy, currently pregnant 08/04/2014  . Susceptible to varicella (non-immune), currently pregnant 08/04/2014  . Rubella non-immune status, antepartum 08/04/2014  . Rh negative state in antepartum period 08/04/2014  . Supervision of normal pregnancy 05/13/2014  . Nausea 05/13/2014  . Headache 10/04/2012  . History of HELLP syndrome, currently pregnant 10/04/2012    Past Surgical History:  Procedure Laterality Date  . ANKLE SURGERY    . FRACTURE SURGERY      OB History    Gravida Para Term Preterm AB Living   3 3 1 2  0 3   SAB TAB Ectopic Multiple Live Births   0 0 0 0 3       Home Medications    Prior to Admission medications   Medication Sig Start Date End Date Taking? Authorizing Provider  acetaminophen (TYLENOL) 500 MG tablet Take 500 mg by mouth every 6 (six) hours as needed for mild pain or moderate pain.   Yes Historical Provider, MD  albuterol (PROVENTIL  HFA;VENTOLIN HFA) 108 (90 BASE) MCG/ACT inhaler Inhale 2 puffs into the lungs every 6 (six) hours as needed for wheezing or shortness of breath.   Yes Historical Provider, MD  naproxen sodium (ALEVE) 220 MG tablet Take 440 mg by mouth daily as needed (for pain).   Yes Historical Provider, MD    Family History Family History  Problem Relation Age of Onset  . Heart disease Mother   . Heart disease Father   . Cancer Paternal Grandmother     cervical  . Heart attack Paternal Grandfather     Social History Social History  Substance Use Topics  . Smoking status: Current Every Day Smoker    Packs/day: 0.25    Types: Cigarettes  . Smokeless tobacco: Never Used  . Alcohol use No     Allergies   Patient has no known allergies.   Review of Systems Review of Systems  Constitutional: Negative for appetite change and fever.  HENT: Positive for dental problem. Negative for congestion, facial swelling, sore throat and trouble swallowing.   Eyes: Negative for pain and visual disturbance.  Musculoskeletal: Negative for neck pain and neck stiffness.  Neurological: Negative for dizziness, facial asymmetry and headaches.  Hematological: Negative for adenopathy.  All other systems reviewed and are negative.    Physical Exam Updated Vital Signs BP 129/71 (BP Location: Left Arm)   Pulse 74   Temp 98.2 F (  36.8 C) (Oral)   Resp 18   Ht 5\' 6"  (1.676 m)   Wt 110.2 kg   LMP 03/03/2016 (Exact Date)   SpO2 100%   BMI 39.22 kg/m   Physical Exam  Constitutional: She is oriented to person, place, and time. She appears well-developed and well-nourished. No distress.  HENT:  Head: Normocephalic and atraumatic.  Right Ear: Tympanic membrane and ear canal normal.  Left Ear: Tympanic membrane and ear canal normal.  Mouth/Throat: Uvula is midline, oropharynx is clear and moist and mucous membranes are normal. No trismus in the jaw. Dental caries present. No dental abscesses or uvula swelling.    Tenderness and dental caries of the left upper second molar.  No facial swelling, obvious dental abscess, trismus, or sublingual abnml.    Neck: Normal range of motion. Neck supple.  Cardiovascular: Normal rate and regular rhythm.   No murmur heard. Pulmonary/Chest: Effort normal and breath sounds normal.  Musculoskeletal: Normal range of motion.  Lymphadenopathy:    She has no cervical adenopathy.  Neurological: She is alert and oriented to person, place, and time. She exhibits normal muscle tone. Coordination normal.  Skin: Skin is warm and dry.  Nursing note and vitals reviewed.    ED Treatments / Results  Labs (all labs ordered are listed, but only abnormal results are displayed) Labs Reviewed - No data to display  EKG  EKG Interpretation None       Radiology No results found.  Procedures Procedures (including critical care time)  Medications Ordered in ED Medications - No data to display   Initial Impression / Assessment and Plan / ED Course  I have reviewed the triage vital signs and the nursing notes.  Pertinent labs & imaging results that were available during my care of the patient were reviewed by me and considered in my medical decision making (see chart for details).  Clinical Course     Pt well appearing.  Vitals stable.  Airway patent.  No concerning sx's for dental abscess or Ludwig's angina.  rx for diclofenac and amoxil.  Referral given for dentistry.  Pt not currently pregnant  Final Clinical Impressions(s) / ED Diagnoses   Final diagnoses:  Pain, dental    New Prescriptions New Prescriptions   No medications on file     Nicole Ausammy Aletha Allebach, PA-C 03/29/16 1827    Lavera Guiseana Duo Liu, MD 03/30/16 1136

## 2016-03-29 NOTE — Discharge Instructions (Signed)
Follow-up with a dentist soon.   °

## 2016-03-29 NOTE — ED Notes (Signed)
Pt presents w/ a broken tooth to the upper left jaw.

## 2016-03-29 NOTE — ED Notes (Signed)
Pt alert & oriented x4, stable gait. Patient given discharge instructions, paperwork & prescription(s). Patient  instructed to stop at the registration desk to finish any additional paperwork. Patient verbalized understanding. Pt left department w/ no further questions. 

## 2016-03-29 NOTE — ED Triage Notes (Signed)
Pain to left upper tooth, rates pain.

## 2016-09-04 ENCOUNTER — Encounter (HOSPITAL_COMMUNITY): Payer: Self-pay | Admitting: Emergency Medicine

## 2016-09-04 ENCOUNTER — Emergency Department (HOSPITAL_COMMUNITY)
Admission: EM | Admit: 2016-09-04 | Discharge: 2016-09-04 | Disposition: A | Discharge status: Home / Self Care | Payer: BLUE CROSS/BLUE SHIELD | Attending: Emergency Medicine | Admitting: Emergency Medicine

## 2016-09-04 DIAGNOSIS — O99513 Diseases of the respiratory system complicating pregnancy, third trimester: Secondary | ICD-10-CM | POA: Insufficient documentation

## 2016-09-04 DIAGNOSIS — O99333 Smoking (tobacco) complicating pregnancy, third trimester: Secondary | ICD-10-CM | POA: Insufficient documentation

## 2016-09-04 DIAGNOSIS — J45909 Unspecified asthma, uncomplicated: Secondary | ICD-10-CM | POA: Insufficient documentation

## 2016-09-04 DIAGNOSIS — O2343 Unspecified infection of urinary tract in pregnancy, third trimester: Secondary | ICD-10-CM

## 2016-09-04 DIAGNOSIS — O26893 Other specified pregnancy related conditions, third trimester: Secondary | ICD-10-CM | POA: Diagnosis present

## 2016-09-04 DIAGNOSIS — F17210 Nicotine dependence, cigarettes, uncomplicated: Secondary | ICD-10-CM | POA: Diagnosis not present

## 2016-09-04 DIAGNOSIS — Z3A30 30 weeks gestation of pregnancy: Secondary | ICD-10-CM | POA: Diagnosis not present

## 2016-09-04 LAB — URINALYSIS, ROUTINE W REFLEX MICROSCOPIC
Bilirubin Urine: NEGATIVE
Glucose, UA: NEGATIVE mg/dL
Ketones, ur: 5 mg/dL — AB
Nitrite: NEGATIVE
Protein, ur: 30 mg/dL — AB
Specific Gravity, Urine: 1.023 (ref 1.005–1.030)
pH: 6 (ref 5.0–8.0)

## 2016-09-04 LAB — CBC WITH DIFFERENTIAL/PLATELET
Basophils Absolute: 0 10*3/uL (ref 0.0–0.1)
Basophils Relative: 0 %
Eosinophils Absolute: 0.1 10*3/uL (ref 0.0–0.7)
Eosinophils Relative: 1 %
HCT: 33 % — ABNORMAL LOW (ref 36.0–46.0)
Hemoglobin: 11 g/dL — ABNORMAL LOW (ref 12.0–15.0)
Lymphocytes Relative: 29 %
Lymphs Abs: 3.6 10*3/uL (ref 0.7–4.0)
MCH: 29.4 pg (ref 26.0–34.0)
MCHC: 33.3 g/dL (ref 30.0–36.0)
MCV: 88.2 fL (ref 78.0–100.0)
Monocytes Absolute: 0.6 10*3/uL (ref 0.1–1.0)
Monocytes Relative: 5 %
Neutro Abs: 8 10*3/uL — ABNORMAL HIGH (ref 1.7–7.7)
Neutrophils Relative %: 65 %
Platelets: 155 10*3/uL (ref 150–400)
RBC: 3.74 MIL/uL — ABNORMAL LOW (ref 3.87–5.11)
RDW: 13.5 % (ref 11.5–15.5)
WBC: 12.3 10*3/uL — ABNORMAL HIGH (ref 4.0–10.5)

## 2016-09-04 LAB — COMPREHENSIVE METABOLIC PANEL
ALT: 18 U/L (ref 14–54)
AST: 23 U/L (ref 15–41)
Albumin: 3.1 g/dL — ABNORMAL LOW (ref 3.5–5.0)
Alkaline Phosphatase: 79 U/L (ref 38–126)
Anion gap: 7 (ref 5–15)
BUN: 5 mg/dL — ABNORMAL LOW (ref 6–20)
CO2: 22 mmol/L (ref 22–32)
Calcium: 8.6 mg/dL — ABNORMAL LOW (ref 8.9–10.3)
Chloride: 108 mmol/L (ref 101–111)
Creatinine, Ser: 0.49 mg/dL (ref 0.44–1.00)
GFR calc Af Amer: >60 mL/min (ref >60)
GFR calc non Af Amer: >60 mL/min (ref >60)
Glucose, Bld: 109 mg/dL — ABNORMAL HIGH (ref 65–99)
Potassium: 3.5 mmol/L (ref 3.5–5.1)
Sodium: 137 mmol/L (ref 135–145)
Total Bilirubin: 0.4 mg/dL (ref 0.3–1.2)
Total Protein: 6.5 g/dL (ref 6.5–8.1)

## 2016-09-04 LAB — PREGNANCY, URINE: Preg Test, Ur: POSITIVE — AB

## 2016-09-04 MED ORDER — CEPHALEXIN 500 MG PO CAPS
500.0000 mg | ORAL_CAPSULE | Freq: Once | ORAL | Status: AC
  Administered 2016-09-04: 500 mg via ORAL
  Filled 2016-09-04: qty 1

## 2016-09-04 MED ORDER — CEPHALEXIN 500 MG PO CAPS: 500.0000 mg | ORAL_CAPSULE | Freq: Three times a day (TID) | ORAL | 0 refills | Status: DC

## 2016-09-04 MED ORDER — PRENATAL COMPLETE 14-0.4 MG PO TABS: 1.0000 | ORAL_TABLET | Freq: Every day | ORAL | 0 refills | Status: DC

## 2016-09-04 NOTE — ED Triage Notes (Signed)
Pt reports that she has back pain and abdominal for 2 -3 days . Pt reports she had a positive pregnancy test over a week ago. Reports pain is lower right back and lower abdomen.

## 2016-09-04 NOTE — ED Provider Notes (Signed)
[facility]-EMERGENCY DEPT Provider Note   CSN: [id number] Arrival date & time: 09/04/16  1304  By signing my name below, I, Sonum Patel, attest that this documentation has been prepared under the direction and in the presence of Raeford Razor, MD. Electronically Signed: Sonum Patel, Neurosurgeon. 09/04/16. 3:47 PM.  History   Chief Complaint Chief Complaint  Patient presents with  . Abdominal Pain  . Back Pain   The history is provided by the patient. No language interpreter was used.     HPI Comments: Nicole Moon is a 26 y.o. female who presents to the Emergency Department complaining of 2-3 days of lower back pain and lower abdominal pain that was initially intermittent and is now constant. She describes the back pain as sharp in nature and notes she had a history of HELLP with a prior pregnancy. She states her last menstrual period was April 21-26 but she had a positive pregnancy test from 1 week ago. She notes associated nausea, vomiting, fever of 101.4, and lightheadedness. She denies dysuria, vaginal discharge, vaginal bleeding.   Past Medical History:  Diagnosis Date  . Asthma   . Gestational diabetes   . History of HELLP syndrome, currently pregnant   . Nausea 05/13/2014  . Pregnant 05/13/2014    Patient Active Problem List   Diagnosis Date Noted  . Amniotic fluid leaking 10/31/2014  . Gestational diabetes mellitus, class A1 09/07/2014  . History of gestational diabetes in prior pregnancy, currently pregnant 08/04/2014  . Susceptible to varicella (non-immune), currently pregnant 08/04/2014  . Rubella non-immune status, antepartum 08/04/2014  . Rh negative state in antepartum period 08/04/2014  . Supervision of normal pregnancy 05/13/2014  . Nausea 05/13/2014  . Headache 10/04/2012  . History of HELLP syndrome, currently pregnant 10/04/2012    Past Surgical History:  Procedure Laterality Date  . ANKLE SURGERY    . FRACTURE SURGERY      OB History    Gravida Para Term  Preterm AB Living   4 3 1 2  0 3   SAB TAB Ectopic Multiple Live Births   0 0 0 0 3       Home Medications    Prior to Admission medications   Medication Sig Start Date End Date Taking? Authorizing Provider  acetaminophen (TYLENOL) 500 MG tablet Take 500 mg by mouth every 6 (six) hours as needed for mild pain or moderate pain.    [provider]  albuterol (PROVENTIL HFA;VENTOLIN HFA) 108 (90 BASE) MCG/ACT inhaler Inhale 2 puffs into the lungs every 6 (six) hours as needed for wheezing or shortness of breath.    [provider]  amoxicillin (AMOXIL) 500 MG capsule Take 1 capsule (500 mg total) by mouth 3 (three) times daily. 03/29/16   Triplett, Tammy, PA-C  diclofenac (VOLTAREN) 75 MG EC tablet Take 1 tablet (75 mg total) by mouth 2 (two) times daily. Take with food 03/29/16   Triplett, Tammy, PA-C  naproxen sodium (ALEVE) 220 MG tablet Take 440 mg by mouth daily as needed (for pain).    [provider]    Family History Family History  Problem Relation Age of Onset  . Heart disease Mother   . Heart disease Father   . Cancer Paternal Grandmother        cervical  . Heart attack Paternal Grandfather     Social History Social History  Substance Use Topics  . Smoking status: Current Every Day Smoker    Packs/day: 0.25    Types: Cigarettes  .  Smokeless tobacco: Never Used  . Alcohol use No     Allergies   Patient has no known allergies.   Review of Systems Review of Systems  All other systems reviewed and are negative for acute change except as noted in the HPI.   Physical Exam Updated Vital Signs BP 126/63 (BP Location: Right Arm)   Pulse 75   Temp 98.2 F (36.8 C) (Oral)   Resp 19   Ht 5\' 6"  (1.676 m)   Wt 237 lb (107.5 kg)   LMP 08/12/2016   SpO2 100%   BMI 38.25 kg/m   Physical Exam  Constitutional: She is oriented to person, place, and time. She appears well-developed and well-nourished. No distress.  HENT:  Head:  Normocephalic and atraumatic.  Eyes: EOM are normal.  Neck: Normal range of motion.  Cardiovascular: Normal rate, regular rhythm and normal heart sounds.   Pulmonary/Chest: Effort normal and breath sounds normal.  Abdominal: Soft. She exhibits no distension. There is tenderness (mild, lower abdominal tenderness).  Gravid uterus  Musculoskeletal: Normal range of motion.  Neurological: She is alert and oriented to person, place, and time.  Skin: Skin is warm and dry.  Psychiatric: She has a normal mood and affect. Judgment normal.  Nursing note and vitals reviewed.    ED Treatments / Results  DIAGNOSTIC STUDIES: Oxygen Saturation is 100% on RA, normal by my interpretation.    COORDINATION OF CARE: 3:38 PM Discussed treatment plan with pt at bedside and pt agreed to plan.   Labs (all labs ordered are listed, but only abnormal results are displayed) Labs Reviewed  URINALYSIS, ROUTINE W REFLEX MICROSCOPIC - Abnormal; Notable for the following:       Result Value   APPearance CLOUDY (*)    Hgb urine dipstick MODERATE (*)    Ketones, ur 5 (*)    Protein, ur 30 (*)    Leukocytes, UA LARGE (*)    Bacteria, UA FEW (*)    Squamous Epithelial / LPF 0-5 (*)    All other components within normal limits  PREGNANCY, URINE - Abnormal; Notable for the following:    Preg Test, Ur POSITIVE (*)    All other components within normal limits  URINE CULTURE    EKG  EKG Interpretation None       Radiology No results found.  Procedures Procedures (including critical care time)    Medications Ordered in ED Medications - No data to display   Initial Impression / Assessment and Plan / ED Course  I have reviewed the triage vital signs and the nursing notes.  Pertinent labs & imaging results that were available during my care of the patient were reviewed by me and considered in my medical decision making (see chart for details).     26yF with lower abdominal pain. Minimal  tenderness on exam. Bedside US showing advanced gestation. ~30w (?) by femur length. FHR in 140s. No bleeding/leakage of fluid. Will treat bacteruria. Needs close OB FU. Return precautions discussed.   Final Clinical Impressions(s) / ED Diagnoses   Final diagnoses:  Urinary tract infection in mother during third trimester of pregnancy    New Prescriptions New Prescriptions   No medications on file   I personally preformed the services scribed in my presence. The recorded information has been reviewed is accurate. Raeford RazorStephen Meleana Commerford, MD.    Raeford RazorKohut, Prabhjot Piscitello, MD 09/16/16 [phone]

## 2016-09-06 LAB — URINE CULTURE

## 2016-09-08 ENCOUNTER — Ambulatory Visit: Payer: BLUE CROSS/BLUE SHIELD | Admitting: Adult Health

## 2017-01-08 ENCOUNTER — Emergency Department (HOSPITAL_COMMUNITY)
Admission: EM | Admit: 2017-01-08 | Discharge: 2017-01-08 | Disposition: A | Discharge status: Home / Self Care | Payer: BLUE CROSS/BLUE SHIELD | Attending: Emergency Medicine | Admitting: Emergency Medicine

## 2017-01-08 ENCOUNTER — Emergency Department (HOSPITAL_COMMUNITY): Payer: BLUE CROSS/BLUE SHIELD

## 2017-01-08 ENCOUNTER — Encounter (HOSPITAL_COMMUNITY): Payer: Self-pay | Admitting: Emergency Medicine

## 2017-01-08 DIAGNOSIS — Z79899 Other long term (current) drug therapy: Secondary | ICD-10-CM | POA: Insufficient documentation

## 2017-01-08 DIAGNOSIS — J069 Acute upper respiratory infection, unspecified: Secondary | ICD-10-CM | POA: Diagnosis not present

## 2017-01-08 DIAGNOSIS — J45909 Unspecified asthma, uncomplicated: Secondary | ICD-10-CM | POA: Diagnosis not present

## 2017-01-08 DIAGNOSIS — Z87891 Personal history of nicotine dependence: Secondary | ICD-10-CM | POA: Insufficient documentation

## 2017-01-08 DIAGNOSIS — R05 Cough: Secondary | ICD-10-CM | POA: Diagnosis present

## 2017-01-08 DIAGNOSIS — J208 Acute bronchitis due to other specified organisms: Secondary | ICD-10-CM

## 2017-01-08 MED ORDER — PREDNISONE 20 MG PO TABS
40.0000 mg | ORAL_TABLET | Freq: Once | ORAL | Status: AC
  Administered 2017-01-08: 40 mg via ORAL
  Filled 2017-01-08: qty 2

## 2017-01-08 MED ORDER — OXYMETAZOLINE HCL 0.05 % NA SOLN
2.0000 | Freq: Once | NASAL | Status: AC
  Administered 2017-01-08: 2 via NASAL
  Filled 2017-01-08: qty 15

## 2017-01-08 MED ORDER — ALBUTEROL SULFATE (2.5 MG/3ML) 0.083% IN NEBU
2.5000 mg | INHALATION_SOLUTION | Freq: Once | RESPIRATORY_TRACT | Status: AC
  Administered 2017-01-08: 2.5 mg via RESPIRATORY_TRACT
  Filled 2017-01-08: qty 3

## 2017-01-08 MED ORDER — ALBUTEROL SULFATE HFA 108 (90 BASE) MCG/ACT IN AERS
2.0000 | INHALATION_SPRAY | Freq: Once | RESPIRATORY_TRACT | Status: AC
  Administered 2017-01-08: 2 via RESPIRATORY_TRACT
  Filled 2017-01-08: qty 6.7

## 2017-01-08 MED ORDER — HYDROCODONE-HOMATROPINE 5-1.5 MG/5ML PO SYRP: 5.0000 mL | ORAL_SOLUTION | Freq: Four times a day (QID) | ORAL | 0 refills | Status: DC | PRN

## 2017-01-08 MED ORDER — DEXAMETHASONE 4 MG PO TABS: 4.0000 mg | ORAL_TABLET | Freq: Two times a day (BID) | ORAL | 0 refills | Status: DC

## 2017-01-08 NOTE — Discharge Instructions (Signed)
Your chest x-ray is negative for acute problem. Your examination suggests bronchitis, located by history of asthma. Please use Afrin to each nostril 3 times a day. Use 2 puffs of albuterol every 4 hours for wheezing and congestion in your chest. Use Decadron 2 times daily with food. Use Hycodan for cough if needed. Hycodan may cause drowsiness. We do not drive, drink alcohol, operate machinery, and legal documents, or participated in activities requiring concentration when taking this medication.

## 2017-01-08 NOTE — ED Triage Notes (Signed)
Pt c/o cough and fever x one week.

## 2017-01-08 NOTE — ED Provider Notes (Signed)
[facility] Provider Note   CSN: [id number] Arrival date & time: 01/08/17  1818     History   Chief Complaint Chief Complaint  Patient presents with  . Cough    HPI Nicole Moon is a 26 y.o. female.  The history is provided by the patient.  Cough  This is a new problem. The current episode started more than 1 week ago. The problem occurs hourly. The problem has been gradually worsening. The cough is productive of sputum. The maximum temperature recorded prior to her arrival was 103 to 104 F. Associated symptoms include chills, rhinorrhea, myalgias, shortness of breath and wheezing. Pertinent negatives include no chest pain and no sore throat. Treatments tried: tylenol and motrin and dayquil. She is not a smoker. Her past medical history is significant for bronchitis and asthma. Her past medical history does not include pneumonia.    Past Medical History:  Diagnosis Date  . Asthma   . Gestational diabetes   . History of HELLP syndrome, currently pregnant   . Nausea 05/13/2014  . Pregnant 05/13/2014    Patient Active Problem List   Diagnosis Date Noted  . Amniotic fluid leaking 10/31/2014  . Gestational diabetes mellitus, class A1 09/07/2014  . History of gestational diabetes in prior pregnancy, currently pregnant 08/04/2014  . Susceptible to varicella (non-immune), currently pregnant 08/04/2014  . Rubella non-immune status, antepartum 08/04/2014  . Rh negative state in antepartum period 08/04/2014  . Supervision of normal pregnancy 05/13/2014  . Nausea 05/13/2014  . Headache 10/04/2012  . History of HELLP syndrome, currently pregnant 10/04/2012    Past Surgical History:  Procedure Laterality Date  . ANKLE SURGERY    . FRACTURE SURGERY      OB History    Gravida Para Term Preterm AB Living   0 3   SAB TAB Ectopic Multiple Live Births   0 0 0 0 3       Home Medications    Prior to Admission medications   Medication Sig Start Date End Date  Taking? Authorizing Provider  acetaminophen (TYLENOL) 500 MG tablet Take 500 mg by mouth every 6 (six) hours as needed for mild pain or moderate pain.    [provider]  albuterol (PROVENTIL HFA;VENTOLIN HFA) 108 (90 BASE) MCG/ACT inhaler Inhale 2 puffs into the lungs every 6 (six) hours as needed for wheezing or shortness of breath.    [provider]  amoxicillin (AMOXIL) 500 MG capsule Take 1 capsule (500 mg total) by mouth 3 (three) times daily. 03/29/16   Triplett, Tammy, PA-C  cephALEXin (KEFLEX) 500 MG capsule Take 1 capsule (500 mg total) by mouth 3 (three) times daily. 09/04/16   Raeford Razor, MD  diclofenac (VOLTAREN) 75 MG EC tablet Take 1 tablet (75 mg total) by mouth 2 (two) times daily. Take with food 03/29/16   Triplett, Tammy, PA-C  naproxen sodium (ALEVE) 220 MG tablet Take 440 mg by mouth daily as needed (for pain).    [provider]  Prenatal Vit-Fe Fumarate-FA (PRENATAL COMPLETE) 14-0.4 MG TABS Take 1 tablet by mouth daily. 09/04/16   Raeford Razor, MD    Family History Family History  Problem Relation Age of Onset  . Heart disease Mother   . Heart disease Father   . Cancer Paternal Grandmother        cervical  . Heart attack Paternal Grandfather     Social History Social History  Substance Use Topics  . Smoking status:  Former Smoker    Packs/day: 0.25    Types: Cigarettes  . Smokeless tobacco: Never Used  . Alcohol use No     Allergies   Patient has no known allergies.   Review of Systems Review of Systems  Constitutional: Positive for chills. Negative for activity change.       All ROS Neg except as noted in HPI  HENT: Positive for rhinorrhea. Negative for nosebleeds and sore throat.   Eyes: Negative for photophobia and discharge.  Respiratory: Positive for cough, shortness of breath and wheezing.   Cardiovascular: Negative for chest pain and palpitations.  Gastrointestinal: Negative for abdominal pain and blood in stool.    Genitourinary: Negative for dysuria, frequency and hematuria.  Musculoskeletal: Positive for myalgias. Negative for arthralgias, back pain and neck pain.  Skin: Negative.   Neurological: Negative for dizziness, seizures and speech difficulty.  Psychiatric/Behavioral: Negative for confusion and hallucinations.     Physical Exam Updated Vital Signs BP (!) 142/91   Pulse (!) 107   Temp 99.5 F (37.5 C)   Resp 20   Ht  (1.676 m)   Wt 103.4 kg (228 lb)   LMP 08/12/2016 (LMP Unknown) Comment: recent pregnancy  SpO2 96%   BMI 36.80 kg/m   Physical Exam  Constitutional: She is oriented to person, place, and time. She appears well-developed and well-nourished.  Non-toxic appearance.  HENT:  Head: Normocephalic.  Right Ear: Tympanic membrane and external ear normal.  Left Ear: Tympanic membrane and external ear normal.  Nasal congestion  Eyes: Pupils are equal, round, and reactive to light. EOM and lids are normal.  Neck: Normal range of motion. Neck supple. Carotid bruit is not present.  Cardiovascular: Normal rate, regular rhythm, normal heart sounds, intact distal pulses and normal pulses.   Pulmonary/Chest: No respiratory distress. She has wheezes. She has no rhonchi.  Abdominal: Soft. Bowel sounds are normal. There is no tenderness. There is no guarding.  Musculoskeletal: Normal range of motion.  Lymphadenopathy:       Head (right side): No submandibular adenopathy present.       Head (left side): No submandibular adenopathy present.    She has no cervical adenopathy.  Neurological: She is alert and oriented to person, place, and time. She has normal strength. No cranial nerve deficit or sensory deficit.  Skin: Skin is warm and dry.  Psychiatric: She has a normal mood and affect. Her speech is normal.  Nursing note and vitals reviewed.    ED Treatments / Results  Labs (all labs ordered are listed, but only abnormal results are displayed) Labs Reviewed - No data to  display  EKG  EKG Interpretation None       Radiology Dg Chest 2 View  Result Date: 01/08/2017 CLINICAL DATA:  Shortness of breath, fever and chest pain for 1 week EXAM: CHEST  2 VIEW COMPARISON:  September 24, 2015 FINDINGS: The heart size and mediastinal contours are within normal limits. Both lungs are clear. The visualized skeletal structures are unremarkable. IMPRESSION: No active cardiopulmonary disease. Electronically Signed   By: Sherian Rein M.D.   On: 01/08/2017 19:49    Procedures Procedures (including critical care time)  Medications Ordered in ED Medications - No data to display   Initial Impression / Assessment and Plan / ED Course  I have reviewed the triage vital signs and the nursing notes.  Pertinent labs & imaging results that were available during my care of the patient were reviewed by me  and considered in my medical decision making (see chart for details).      Final Clinical Impressions(s) / ED Diagnoses MDM Vital signs of been reviewed. Pulse oximetry is 96% on room air. Within normal limits by my interpretation. The chest x-ray shows no active cardiopulmonary disease. The examination favors bronchitis, aggravated by history of asthma and upper respiratory infection. The patient will be treated with Afrin spray for 5 days only, albuterol every 4 hours as needed, Ecotrin on 2 times daily, and Hycodan for cough if needed. I've instructed the patient that this medication may cause drowsiness. We discuss the importance of not driving a vehicle, operating she and her, or pertussis opinion activities requiring concentration when taking this medication. The patient will see her primary physician or return to the emergency department if any changes, problems, or concerns.    Final diagnoses:  None    New Prescriptions New Prescriptions   No medications on file     Duayne Cal 01/08/17 2118    Bethann Berkshire, MD 01/10/17 [phone]

## 2017-11-11 ENCOUNTER — Emergency Department (HOSPITAL_COMMUNITY): Payer: Self-pay

## 2017-11-11 ENCOUNTER — Other Ambulatory Visit: Payer: Self-pay

## 2017-11-11 ENCOUNTER — Encounter (HOSPITAL_COMMUNITY): Payer: Self-pay | Admitting: *Deleted

## 2017-11-11 ENCOUNTER — Emergency Department (HOSPITAL_COMMUNITY)
Admission: EM | Admit: 2017-11-11 | Discharge: 2017-11-11 | Disposition: A | Discharge status: Home / Self Care | Payer: Self-pay | Attending: Emergency Medicine | Admitting: Emergency Medicine

## 2017-11-11 DIAGNOSIS — N210 Calculus in bladder: Secondary | ICD-10-CM | POA: Insufficient documentation

## 2017-11-11 DIAGNOSIS — N3001 Acute cystitis with hematuria: Secondary | ICD-10-CM | POA: Insufficient documentation

## 2017-11-11 DIAGNOSIS — J45909 Unspecified asthma, uncomplicated: Secondary | ICD-10-CM | POA: Insufficient documentation

## 2017-11-11 DIAGNOSIS — Z79899 Other long term (current) drug therapy: Secondary | ICD-10-CM | POA: Insufficient documentation

## 2017-11-11 DIAGNOSIS — Z87891 Personal history of nicotine dependence: Secondary | ICD-10-CM | POA: Insufficient documentation

## 2017-11-11 HISTORY — DX: HELLP syndrome (HELLP), unspecified trimester: O14.20

## 2017-11-11 LAB — URINALYSIS, ROUTINE W REFLEX MICROSCOPIC
Bacteria, UA: NONE SEEN
Bilirubin Urine: NEGATIVE
Glucose, UA: NEGATIVE mg/dL
Ketones, ur: NEGATIVE mg/dL
Nitrite: NEGATIVE
PH: 5 (ref 5.0–8.0)
Protein, ur: 30 mg/dL — AB
RBC / HPF: >50 RBC/hpf — ABNORMAL HIGH (ref 0–5)
SPECIFIC GRAVITY, URINE: 1.032 — AB (ref 1.005–1.030)
WBC, UA: >50 WBC/hpf — ABNORMAL HIGH (ref 0–5)

## 2017-11-11 LAB — PREGNANCY, URINE: Preg Test, Ur: NEGATIVE

## 2017-11-11 MED ORDER — CEPHALEXIN 500 MG PO CAPS: 500.0000 mg | ORAL_CAPSULE | Freq: Four times a day (QID) | ORAL | 0 refills | Status: DC

## 2017-11-11 MED ORDER — CEPHALEXIN 500 MG PO CAPS
500.0000 mg | ORAL_CAPSULE | Freq: Once | ORAL | Status: AC
  Administered 2017-11-11: 500 mg via ORAL
  Filled 2017-11-11: qty 1

## 2017-11-11 MED ORDER — KETOROLAC TROMETHAMINE 30 MG/ML IJ SOLN
30.0000 mg | Freq: Once | INTRAMUSCULAR | Status: AC
  Administered 2017-11-11: 30 mg via INTRAMUSCULAR
  Filled 2017-11-11: qty 1

## 2017-11-11 MED ORDER — HYDROCODONE-ACETAMINOPHEN 5-325 MG PO TABS
1.0000 | ORAL_TABLET | Freq: Once | ORAL | Status: AC
  Administered 2017-11-11: 1 via ORAL
  Filled 2017-11-11: qty 1

## 2017-11-11 MED ORDER — HYDROCODONE-ACETAMINOPHEN 5-325 MG PO TABS: 1.0000 | ORAL_TABLET | ORAL | 0 refills | Status: DC | PRN

## 2017-11-11 NOTE — ED Triage Notes (Signed)
Pt c/o pain "between my legs". Pt denies abdominal pain, vaginal discharge, dysuria, vomiting, fever. Pt reports left flank pain 2 days ago. Nausea yesterday. Pt does report frequency or urination that started today.

## 2017-11-11 NOTE — ED Provider Notes (Signed)
[facility] EMERGENCY DEPARTMENT Provider Note   CSN: [id number] Arrival date & time: 11/11/17  1639     History   Chief Complaint Chief Complaint  Patient presents with  . Urinary Frequency    HPI Nicole Moon is a 27 y.o. female with a history of infrequent uti's and prior history of kidney stones with spontaneous passage presenting with dysuria including increased urinary frequency with small amounts of urine, painful urination and urgency along with constant pressure in her suprapubic region.  She denies hematuria.  She also notes having intermittent fleeting stabs of right flank pain 2 days ago while at work, which have resolved. She denies back pain, fevers, chills, vomiting, but had some mild nausea today also denies vaginal discharge. She has had no treatments for her symptoms prior to arrival.    Of note, chart reflects pt is currently pregnant. Pt denies, LMP approx 3 weeks ago.  The history is provided by the patient.    Past Medical History:  Diagnosis Date  . Asthma   . Gestational diabetes   . HELLP syndrome   . Nausea 05/13/2014    Patient Active Problem List   Diagnosis Date Noted  . Amniotic fluid leaking 10/31/2014  . Gestational diabetes mellitus, class A1 09/07/2014  . History of gestational diabetes in prior pregnancy, currently pregnant 08/04/2014  . Susceptible to varicella (non-immune), currently pregnant 08/04/2014  . Rubella non-immune status, antepartum 08/04/2014  . Rh negative state in antepartum period 08/04/2014  . Supervision of normal pregnancy 05/13/2014  . Nausea 05/13/2014  . Headache 10/04/2012  . History of HELLP syndrome, currently pregnant 10/04/2012    Past Surgical History:  Procedure Laterality Date  . ANKLE SURGERY    . FRACTURE SURGERY    . TUBAL LIGATION       OB History    Gravida  4   Para  4   Term  2   Preterm  2   AB  0   Living  3     SAB  0   TAB  0   Ectopic  0   Multiple  0   Live Births   3            Home Medications    Prior to Admission medications   Medication Sig Start Date End Date Taking? Authorizing Provider  acetaminophen (TYLENOL) 500 MG tablet Take 500 mg by mouth every 6 (six) hours as needed for mild pain or moderate pain.    [provider]  albuterol (PROVENTIL HFA;VENTOLIN HFA) 108 (90 BASE) MCG/ACT inhaler Inhale 2 puffs into the lungs every 6 (six) hours as needed for wheezing or shortness of breath.    [provider]  amoxicillin (AMOXIL) 500 MG capsule Take 1 capsule (500 mg total) by mouth 3 (three) times daily. 03/29/16   Triplett, Tammy, PA-C  cephALEXin (KEFLEX) 500 MG capsule Take 1 capsule (500 mg total) by mouth 4 (four) times daily. 11/11/17   Burgess Amor, PA-C  dexamethasone (DECADRON) 4 MG tablet Take 1 tablet (4 mg total) by mouth 2 (two) times daily with a meal. 01/08/17   Ivery Quale, PA-C  diclofenac (VOLTAREN) 75 MG EC tablet Take 1 tablet (75 mg total) by mouth 2 (two) times daily. Take with food 03/29/16   Triplett, Tammy, PA-C  HYDROcodone-acetaminophen (NORCO/VICODIN) 5-325 MG tablet Take 1 tablet by mouth every 4 (four) hours as needed. 11/11/17   Burgess Amor, PA-C  HYDROcodone-homatropine (HYCODAN) 5-1.5 MG/5ML syrup Take  5 mLs by mouth every 6 (six) hours as needed. 01/08/17   Ivery QualeBryant, Hobson, PA-C  naproxen sodium (ALEVE) 220 MG tablet Take 440 mg by mouth daily as needed (for pain).    [provider]  Prenatal Vit-Fe Fumarate-FA (PRENATAL COMPLETE) 14-0.4 MG TABS Take 1 tablet by mouth daily. 09/04/16   Raeford RazorKohut, Stephen, MD    Family History Family History  Problem Relation Age of Onset  . Heart disease Mother   . Heart disease Father   . Cancer Paternal Grandmother        cervical  . Heart attack Paternal Grandfather     Social History Social History   Tobacco Use  . Smoking status: Former Smoker    Packs/day: 0.25    Types: Cigarettes  . Smokeless tobacco: Never Used  Substance Use Topics    . Alcohol use: Yes    Comment: occasionally   . Drug use: No     Allergies   Patient has no known allergies.   Review of Systems Review of Systems  Constitutional: Negative for chills and fever.  HENT: Negative for congestion and sore throat.   Eyes: Negative.   Respiratory: Negative for chest tightness and shortness of breath.   Cardiovascular: Negative for chest pain.  Gastrointestinal: Positive for nausea. Negative for abdominal pain and vomiting.  Genitourinary: Positive for dysuria, flank pain, frequency and urgency. Negative for vaginal discharge.  Musculoskeletal: Negative for arthralgias, joint swelling and neck pain.  Skin: Negative.  Negative for rash and wound.  Neurological: Negative for dizziness, weakness, light-headedness, numbness and headaches.  Psychiatric/Behavioral: Negative.      Physical Exam Updated Vital Signs BP 124/74 (BP Location: Right Arm)   Pulse 95   Temp 97.7 F (36.5 C) (Oral)   Resp 16   Ht 5\' 6"  (1.676 m)   Wt 92.1 kg (203 lb)   LMP 10/14/2017   SpO2 100%   Breastfeeding? No   BMI 32.77 kg/m   Physical Exam  Constitutional: She appears well-developed and well-nourished.  HENT:  Head: Normocephalic and atraumatic.  Eyes: Conjunctivae are normal.  Neck: Normal range of motion.  Cardiovascular: Normal rate, regular rhythm, normal heart sounds and intact distal pulses.  Pulmonary/Chest: Effort normal and breath sounds normal. She has no wheezes.  Abdominal: Soft. Bowel sounds are normal. There is tenderness in the suprapubic area. There is no rebound, no guarding and no CVA tenderness.  Musculoskeletal: Normal range of motion.  Neurological: She is alert.  Skin: Skin is warm and dry.  Psychiatric: She has a normal mood and affect.  Nursing note and vitals reviewed.    ED Treatments / Results  Labs (all labs ordered are listed, but only abnormal results are displayed) Labs Reviewed  URINALYSIS, ROUTINE W REFLEX MICROSCOPIC  - Abnormal; Notable for the following components:      Result Value   Color, Urine AMBER (*)    APPearance CLOUDY (*)    Specific Gravity, Urine 1.032 (*)    Hgb urine dipstick LARGE (*)    Protein, ur 30 (*)    Leukocytes, UA MODERATE (*)    RBC / HPF >50 (*)    WBC, UA >50 (*)    All other components within normal limits  URINE CULTURE  PREGNANCY, URINE    EKG None  Radiology Ct Renal Stone Study  Result Date: 11/11/2017 CLINICAL DATA:  Left flank pain and pelvic pain for 2 days. Nausea. Stone disease suspected. EXAM: CT ABDOMEN AND PELVIS WITHOUT  CONTRAST TECHNIQUE: Multidetector CT imaging of the abdomen and pelvis was performed following the standard protocol without IV contrast. COMPARISON:  09/28/2013 FINDINGS: Lower chest: No acute findings. Hepatobiliary: No mass visualized on this unenhanced exam. Gallbladder is unremarkable. Pancreas: No mass or inflammatory process visualized on this unenhanced exam. Spleen:  Within normal limits in size. Adrenals/Urinary tract: A few tiny punctate 1-2 mm nonobstructing calculi are seen in the lower pole the left kidney. No evidence of ureteral calculi or hydronephrosis. A 3 mm calculus is again seen within the urinary bladder. Stomach/Bowel: No evidence of obstruction, inflammatory process, or abnormal fluid collections. Normal appendix visualized. Vascular/Lymphatic: No pathologically enlarged lymph nodes identified. No evidence of abdominal aortic aneurysm. Reproductive:  No mass or other significant abnormality. Other:  None. Musculoskeletal:  No suspicious bone lesions identified. IMPRESSION: Tiny nonobstructing left renal calculi and urinary bladder calculus. No evidence of ureteral calculi, hydronephrosis, or other acute findings. Electronically Signed   By: Myles Rosenthal M.D.   On: 11/11/2017 19:39    Procedures Procedures (including critical care time)  Medications Ordered in ED Medications  HYDROcodone-acetaminophen (NORCO/VICODIN)  5-325 MG per tablet 1 tablet (has no administration in time range)  cephALEXin (KEFLEX) capsule 500 mg (500 mg Oral Given 11/11/17 1938)  ketorolac (TORADOL) 30 MG/ML injection 30 mg (30 mg Intramuscular Given 11/11/17 1939)     Initial Impression / Assessment and Plan / ED Course  I have reviewed the triage vital signs and the nursing notes.  Pertinent labs & imaging results that were available during my care of the patient were reviewed by me and considered in my medical decision making (see chart for details).     Pt with uti, cx added along with bladder stone.  With recent right flank pain, suspect this is a resolving right renal stone.  She has an infected urine, but no Ct or clinical findings to suggest pyelonephritis. There is no reason this stone will not completely resolve.  VSS, afebrile.  Stable for outpt tx with close f/u for worsened sx, return precautions outlined. Labs and imaging reviewed. Pt given urine strainer, referral to urology for ongoing care. Keflex, hydrocodone prescribed.  Final Clinical Impressions(s) / ED Diagnoses   Final diagnoses:  Acute cystitis with hematuria  Urinary bladder stone    ED Discharge Orders        Ordered    cephALEXin (KEFLEX) 500 MG capsule  4 times daily     11/11/17 2004    HYDROcodone-acetaminophen (NORCO/VICODIN) 5-325 MG tablet  Every 4 hours PRN     11/11/17 2004       Burgess Amor, PA-C 11/12/17 1427    Long, Arlyss Repress, MD 11/13/17 [phone]

## 2017-11-11 NOTE — Discharge Instructions (Addendum)
As discussed, you do have a urinary infection, but also a 3 mm stone which has already passed into your bladder so you should not have any complications from this passing completely.  However, you also have a small kidney stone in your left kidney which has not yet passed.  You do need to have a recheck of your urine to make sure your infection has resolved once the antibiotics are completed.  Get rechecked immediately for any development of fever, nausea/ vomiting, worse pain or any other new complaints.  Strain your urine so you will know for sure the this stone in your bladder has passed.

## 2017-11-14 LAB — URINE CULTURE
Culture: >=100000 — AB
Special Requests: NORMAL

## 2017-11-15 ENCOUNTER — Telehealth: Payer: Self-pay | Admitting: Emergency Medicine

## 2017-11-15 NOTE — Telephone Encounter (Signed)
Post ED Visit - Positive Culture Follow-up: Successful Patient Follow-Up  Culture assessed and recommendations reviewed by:  []  Enzo BiNathan Batchelder, Pharm.D. []  Celedonio MiyamotoJeremy Frens, Pharm.D., BCPS AQ-ID []  Garvin FilaMike Maccia, Pharm.D., BCPS []  Georgina PillionElizabeth Martin, Pharm.D., BCPS []  WaltonvilleMinh Pham, [address] BoulevardPharm.D., BCPS, AAHIVP []  Estella HuskMichelle Turner, Pharm.D., BCPS, AAHIVP [x]  Lysle Pearlachel Rumbarger, PharmD, BCPS []  Phillips Climeshuy Dang, PharmD, BCPS []  Agapito GamesAlison Masters, PharmD, BCPS []  Verlan FriendsErin Deja, PharmD  Positive urine culture  []  Patient discharged without antimicrobial prescription and treatment is now indicated [x]  Organism is resistant to prescribed ED discharge antimicrobial []  Patient with positive blood cultures  Changes discussed with ED provider:Brandon Olevia BowensMorelli PA New antibiotic prescription stop cephelexin, start macrobid 100mg  po bid x 7 days  Attempting to contact patient   Berle MullMiller, Mekenzie Modeste 11/15/2017, 2:04 PM

## 2017-11-15 NOTE — Progress Notes (Signed)
ED Antimicrobial Stewardship Positive Culture Follow Up   Obie DredgeBrittany Salsbury is an 27 y.o. female who [facility] with a chief complaint of  Chief Complaint  Patient presents with  . Urinary Frequency    Recent Results (from the past 720 hour(s))  Urine Culture     Status: Abnormal   Collection Time: 11/11/17  5:41 PM  Result Value Ref Range Status   Specimen Description   Final    URINE, CLEAN CATCH Performed at [facility], [address] [place], [place] [address]    Special Requests   Final    Normal Performed at [facility], [address] [place], [place] [address]    Culture (A)  Final    >=100,000 COLONIES/mL ESCHERICHIA COLI Confirmed Extended Spectrum Beta-Lactamase Producer (ESBL).  In bloodstream infections from ESBL organisms, carbapenems are preferred over piperacillin/tazobactam. They are shown to have a lower risk of mortality. Performed at [facility], [address] [place], [place] [address]    Report Status 11/14/2017 FINAL  Final   Organism ID, Bacteria ESCHERICHIA COLI (A)  Final      Susceptibility   Escherichia coli - MIC*    AMPICILLIN >=32 RESISTANT Resistant     CEFAZOLIN >=64 RESISTANT Resistant     CEFTRIAXONE >=64 RESISTANT Resistant     CIPROFLOXACIN >=4 RESISTANT Resistant     GENTAMICIN <=1 SENSITIVE Sensitive     IMIPENEM <=0.25 SENSITIVE Sensitive     NITROFURANTOIN <=16 SENSITIVE Sensitive     TRIMETH/SULFA <=20 SENSITIVE Sensitive     AMPICILLIN/SULBACTAM 8 SENSITIVE Sensitive     PIP/TAZO <=4 SENSITIVE Sensitive     Extended ESBL POSITIVE Resistant     * >=100,000 COLONIES/mL ESCHERICHIA COLI    [x]  Treated with cephalexin, organism resistant to prescribed antimicrobial []  Patient discharged originally without antimicrobial agent and treatment is now indicated  New antibiotic prescription: DC cephalexin, start macrobid 100mg  PO BID x 7 days  ED Provider: Harlene SaltsBrandon Morelli, PA   Michaeal Armbrust, Drake LeachRachel  Lynn 11/15/2017, 9:44 AM Clinical Pharmacist Monday - Friday phone -  [phone] Saturday - Sunday phone - [phone]

## 2019-08-07 ENCOUNTER — Encounter (HOSPITAL_COMMUNITY): Payer: Self-pay | Admitting: *Deleted

## 2019-08-07 ENCOUNTER — Emergency Department (HOSPITAL_COMMUNITY): Payer: Self-pay

## 2019-08-07 ENCOUNTER — Other Ambulatory Visit: Payer: Self-pay

## 2019-08-07 ENCOUNTER — Emergency Department (HOSPITAL_COMMUNITY)
Admission: EM | Admit: 2019-08-07 | Discharge: 2019-08-07 | Disposition: A | Discharge status: Home / Self Care | Payer: Self-pay | Attending: Emergency Medicine | Admitting: Emergency Medicine

## 2019-08-07 DIAGNOSIS — Z20822 Contact with and (suspected) exposure to covid-19: Secondary | ICD-10-CM | POA: Insufficient documentation

## 2019-08-07 DIAGNOSIS — R05 Cough: Secondary | ICD-10-CM | POA: Insufficient documentation

## 2019-08-07 DIAGNOSIS — R5383 Other fatigue: Secondary | ICD-10-CM | POA: Insufficient documentation

## 2019-08-07 DIAGNOSIS — R0981 Nasal congestion: Secondary | ICD-10-CM | POA: Insufficient documentation

## 2019-08-07 DIAGNOSIS — F17210 Nicotine dependence, cigarettes, uncomplicated: Secondary | ICD-10-CM | POA: Insufficient documentation

## 2019-08-07 DIAGNOSIS — R509 Fever, unspecified: Secondary | ICD-10-CM | POA: Insufficient documentation

## 2019-08-07 DIAGNOSIS — J45909 Unspecified asthma, uncomplicated: Secondary | ICD-10-CM | POA: Insufficient documentation

## 2019-08-07 DIAGNOSIS — M7918 Myalgia, other site: Secondary | ICD-10-CM | POA: Insufficient documentation

## 2019-08-07 DIAGNOSIS — J069 Acute upper respiratory infection, unspecified: Secondary | ICD-10-CM | POA: Insufficient documentation

## 2019-08-07 LAB — SARS CORONAVIRUS 2 (TAT 6-24 HRS): SARS Coronavirus 2: NEGATIVE

## 2019-08-07 LAB — POC URINE PREG, ED: Preg Test, Ur: NEGATIVE

## 2019-08-07 MED ORDER — BENZONATATE 100 MG PO CAPS
100.0000 mg | ORAL_CAPSULE | Freq: Three times a day (TID) | ORAL | 0 refills | Status: DC
Start: 2019-08-07 — End: 2020-05-06

## 2019-08-07 NOTE — Discharge Instructions (Addendum)
You have been diagnosed today with Viral URI with cough, suspected Covid-19 virus infection.  At this time there does not appear to be the presence of an emergent medical condition, however there is always the potential for conditions to change. Please read and follow the below instructions.  Please return to the Emergency Department immediately for any new or worsening symptoms. Please be sure to follow up with your Primary Care Provider within one week regarding your visit today; please call their office to schedule an appointment even if you are feeling better for a follow-up visit. Your Covid test is currently pending.  You may view your results in the next 1 day on your MyChart account.  Please discuss those results immediately with your primary care provider when available.  Despite your test results I advised that you continue wearing your mask and continue quarantining until 10 days after symptom onset in case of false negative test. Please drink plenty of water and get plenty of rest.  Water is important to avoid dehydration.  You may use the medication Tessalon as prescribed to help with cough. Please take Ibuprofen (Advil, motrin) and Tylenol (acetaminophen) to relieve your pain.  You may take up to 400 MG (2 pills) of normal strength ibuprofen every 8 hours as needed.  In between doses of ibuprofen you make take tylenol, up to 500 mg (one extra strength pill).  Do not take more than 3,000 mg tylenol in a 24 hour period.  Please check all medication labels as many medications such as pain and cold medications may contain tylenol.  Do not drink alcohol while taking these medications.  Do not take other NSAID'S while taking ibuprofen (such as aleve or naproxen).  Please take ibuprofen with food to decrease stomach upset.  Get help right away if: You have shortness of breath that gets worse. You have very bad or constant: Headache. Ear pain. Pain in your forehead, behind your eyes, and over  your cheekbones (sinus pain). Chest pain. You have long-lasting (chronic) lung disease along with any of these: Wheezing. Long-lasting cough. Coughing up blood. A change in your usual mucus. You have a stiff neck. You have changes in your: Vision. Hearing. Thinking. Mood. You have any new/concerning or worsening of symptoms   Please read the additional information packets attached to your discharge summary.  Do not take your medicine if  develop an itchy rash, swelling in your mouth or lips, or difficulty breathing; call 911 and seek immediate emergency medical attention if this occurs.  Note: Portions of this text may have been transcribed using voice recognition software. Every effort was made to ensure accuracy; however, inadvertent computerized transcription errors may still be present.

## 2019-08-07 NOTE — ED Provider Notes (Signed)
[facility] Provider Note   CSN: [id number] Arrival date & time: 08/07/19  8938     History Chief Complaint  Patient presents with  . URI    Nicole Moon is a 29 y.o. female reports is otherwise healthy no daily medication use who reports that she has a history of asthma and only uses albuterol inhaler as needed.  Otherwise healthy no other daily medication use.   Patient presents today for URI symptoms x2 days.  Initially reports mild rhinorrhea and fatigue, since yesterday morning she has also developed cough productive with clear sputum, nasal congestion, mild generalized body aches and fever up to 101.6 at home.  She took 1 ibuprofen last night with moderate relief of symptoms. Generalized body aches described as diffuse aching sensation of the muscles and joints nonradiating no clear aggravating or alleviating symptoms, mild intensity constant improved with ibuprofen.  Patient denies headache, vision changes, sore throat, neck stiffness, chest pain/shortness of breath, nausea/vomiting, abdominal pain, urinary symptoms, patient any swelling/color change, history of blood clot or any additional concerns. HPI     Past Medical History:  Diagnosis Date  . Asthma   . Gestational diabetes    gestational diabetes  . HELLP syndrome   . Nausea 05/13/2014    Patient Active Problem List   Diagnosis Date Noted  . Amniotic fluid leaking 10/31/2014  . Gestational diabetes mellitus, class A1 09/07/2014  . History of gestational diabetes in prior pregnancy, currently pregnant 08/04/2014  . Susceptible to varicella (non-immune), currently pregnant 08/04/2014  . Rubella non-immune status, antepartum 08/04/2014  . Rh negative state in antepartum period 08/04/2014  . Supervision of normal pregnancy 05/13/2014  . Nausea 05/13/2014  . Headache 10/04/2012  . History of HELLP syndrome, currently pregnant 10/04/2012    Past Surgical History:  Procedure Laterality Date    . ANKLE SURGERY    . FRACTURE SURGERY    . TUBAL LIGATION       OB History    Gravida  4   Para  4   Term  2   Preterm  2   AB  0   Living  3     SAB  0   TAB  0   Ectopic  0   Multiple  0   Live Births  3           Family History  Problem Relation Age of Onset  . Heart disease Mother   . Heart disease Father   . Cancer Paternal Grandmother        cervical  . Heart attack Paternal Grandfather     Social History   Tobacco Use  . Smoking status: Current Some Day Smoker    Packs/day: 0.25    Types: Cigarettes  . Smokeless tobacco: Never Used  Substance Use Topics  . Alcohol use: Yes    Comment: occasionally   . Drug use: No    Home Medications Prior to Admission medications   Medication Sig Start Date End Date Taking? Authorizing Provider  acetaminophen (TYLENOL) 500 MG tablet Take 500 mg by mouth every 6 (six) hours as needed for mild pain or moderate pain.   Yes [provider]  albuterol (PROVENTIL HFA;VENTOLIN HFA) 108 (90 BASE) MCG/ACT inhaler Inhale 2 puffs into the lungs every 6 (six) hours as needed for wheezing or shortness of breath.   Yes [provider]  naproxen sodium (ALEVE) 220 MG tablet Take 440 mg by mouth daily as needed (  for pain).   Yes [provider]  Phenylephrine-DM-GG-APAP 5-10-200-325 MG TABS Take 1-2 tablets by mouth every 6 (six) hours as needed.   Yes [provider]  benzonatate (TESSALON) 100 MG capsule Take 1 capsule (100 mg total) by mouth every 8 (eight) hours. 08/07/19   Bill Salinas, PA-C    Allergies    Patient has no known allergies.  Review of Systems   Review of Systems  Constitutional: Positive for chills, fatigue and fever. Negative for diaphoresis.  HENT: Positive for congestion and rhinorrhea. Negative for facial swelling, sore throat, trouble swallowing and voice change.   Eyes: Negative.  Negative for visual disturbance.  Respiratory: Positive for cough.  Negative for shortness of breath.   Cardiovascular: Negative.  Negative for chest pain and leg swelling.  Gastrointestinal: Negative.  Negative for abdominal pain, diarrhea, nausea and vomiting.  Musculoskeletal: Positive for arthralgias and myalgias. Negative for neck pain and neck stiffness.  Neurological: Negative.  Negative for weakness, numbness and headaches.    Physical Exam Updated Vital Signs BP 126/75 (BP Location: Right Arm)   Pulse 95   Temp 98.4 F (36.9 C) (Oral)   Resp 18   Ht 5\' 6"  (1.676 m)   Wt 99.8 kg   LMP 07/27/2019   SpO2 99%   BMI 35.51 kg/m   Physical Exam Constitutional:      General: She is not in acute distress.    Appearance: Normal appearance. She is well-developed. She is obese. She is not ill-appearing or diaphoretic.  HENT:     Head: Normocephalic and atraumatic.     Jaw: There is normal jaw occlusion. No trismus.     Right Ear: External ear normal.     Left Ear: External ear normal.     Nose: Rhinorrhea present. Rhinorrhea is clear.     Right Sinus: No maxillary sinus tenderness or frontal sinus tenderness.     Left Sinus: No maxillary sinus tenderness or frontal sinus tenderness.     Mouth/Throat:     Mouth: Mucous membranes are moist.     Pharynx: Oropharynx is clear. Uvula midline.  Eyes:     General: Vision grossly intact. Gaze aligned appropriately.     Extraocular Movements: Extraocular movements intact.     Pupils: Pupils are equal, round, and reactive to light.  Neck:     Trachea: Trachea and phonation normal. No tracheal deviation.     Meningeal: Brudzinski's sign absent.  Cardiovascular:     Rate and Rhythm: Normal rate and regular rhythm.     Pulses:          Dorsalis pedis pulses are 2+ on the right side and 2+ on the left side.     Heart sounds: Normal heart sounds.  Pulmonary:     Effort: Pulmonary effort is normal. No respiratory distress.     Breath sounds: Normal breath sounds and air entry.  Abdominal:     General:  There is no distension.     Palpations: Abdomen is soft.     Tenderness: There is no abdominal tenderness. There is no guarding or rebound.  Musculoskeletal:        General: Normal range of motion.     Cervical back: Full passive range of motion without pain, normal range of motion and neck supple.     Right lower leg: No edema.     Left lower leg: No edema.  Skin:    General: Skin is warm and dry.  Neurological:     Mental Status: She is alert.     GCS: GCS eye subscore is 4. GCS verbal subscore is 5. GCS motor subscore is 6.     Comments: Speech is clear and goal oriented, follows commands Major Cranial nerves without deficit, no facial droop Moves extremities without ataxia, coordination intact  Psychiatric:        Behavior: Behavior normal.     ED Results / Procedures / Treatments   Labs (all labs ordered are listed, but only abnormal results are displayed) Labs Reviewed  SARS CORONAVIRUS 2 (TAT 6-24 HRS)  POC URINE PREG, ED    EKG None  Radiology DG Chest Portable 1 View  Result Date: 08/07/2019 CLINICAL DATA:  Nasal congestion with productive cough worse over the past 2-3 days. EXAM: PORTABLE CHEST 1 VIEW COMPARISON:  01/08/2017 FINDINGS: Lungs are adequately inflated and otherwise clear. Cardiomediastinal silhouette and remainder of the exam is unchanged. IMPRESSION: No active disease. Electronically Signed   By: Elberta Fortis M.D.   On: 08/07/2019 11:00    Procedures Procedures (including critical care time)  Medications Ordered in ED Medications - No data to display  ED Course  I have reviewed the triage vital signs and the nursing notes.  Pertinent labs & imaging results that were available during my care of the patient were reviewed by me and considered in my medical decision making (see chart for details).     Nicole Moon was evaluated in Emergency Department on 08/07/2019 for the symptoms described in the history of present illness. She was evaluated  in the context of the global COVID-19 pandemic, which necessitated consideration that the patient might be at risk for infection with the SARS-CoV-2 virus that causes COVID-19. Institutional protocols and algorithms that pertain to the evaluation of patients at risk for COVID-19 are in a state of rapid change based on information released by regulatory bodies including the CDC and federal and state organizations. These policies and algorithms were followed during the patient's care in the ED. MDM Rules/Calculators/A&P                     29 year old female presents today for URI symptoms x2 days.  Primarily complaining of fatigue, body aches, fever and cough productive with clear sputum.  On examination she is well-appearing, nontoxic and in no acute distress.  Vital signs stable, afebrile no tachycardia, hypotension, tachypnea or hypoxia on room air.  She has not taken any antipyretics today.  She has no known Covid exposures but does work at a [employer].  High suspicion for viral URI at this time.  Physical examination reassuring, no cranial nerve deficit, airway clear without evidence of pharyngitis or deep space infection, no meningeal signs, heart regular rate and rhythm, lungs clear, abdomen soft nontender, neurovascular intact to all 4 extremities without evidence of DVT.  Patient has no chest pain or shortness of breath.  She reports that she has albuterol inhaler but she has not had to use it and does not need a refill.  I have ordered chest x-ray and pregnancy test today along with outpatient Covid test.  Pregnancy test is negative. Chest x-ray:    IMPRESSION:  No active disease.  I have personally reviewed patient's chest x-ray above and agree with radiologist interpretation.  Per my interpretation no obvious pneumothorax or consolidation. - No evidence of bacterial disease such as pneumonia, meningitis or sinusitis requiring antibiotics at this time.  Additionally patient has no chest  pain or shortness of breath to suggest ACS, PE or other emergent cardiopulmonary etiologies at this time. Suspect patient experiencing viral upper respiratory tract infection.  Given ongoing pandemic I advised patient to quarantine and continue wearing her mask until symptom-free and 10 days post symptom onset.  She will follow-up on her Covid test with her primary care provider and discuss results at that time with them.  I advised her to drink plenty of water to avoid dehydration, continue use of OTC anti-inflammatories such as ibuprofen and Tylenol as directed on the packaging.  Will prescribe Tessalon to help with cough and give strict ED return precautions.  Work note provided for return 08/16/2019.  At this time there does not appear to be any evidence of an acute emergency medical condition and the patient appears stable for discharge with appropriate outpatient follow up. Diagnosis was discussed with patient who verbalizes understanding of care plan and is agreeable to discharge. I have discussed return precautions with patient who verbalizes understanding of return precautions. Patient encouraged to follow-up with their PCP. All questions answered.  Patient has been discharged in good condition.   Note: Portions of this report may have been transcribed using voice recognition software. Every effort was made to ensure accuracy; however, inadvertent computerized transcription errors may still be present. Final Clinical Impression(s) / ED Diagnoses Final diagnoses:  Viral URI with cough  Suspected COVID-19 virus infection    Rx / DC Orders ED Discharge Orders         Ordered    benzonatate (TESSALON) 100 MG capsule  Every 8 hours     08/07/19 1125           Elizabeth Palau 08/07/19 1133    Long, Arlyss Repress, MD 08/07/19 1721

## 2019-08-07 NOTE — ED Triage Notes (Signed)
Pt c/o nasal congestion with productive cough that has gotten worse for the last 2-3 days; pt's temp at home has been as high as 101.6; pt states she has been having chills and body aches; pt has been taking OTC with some relief

## 2020-05-06 ENCOUNTER — Other Ambulatory Visit: Payer: Self-pay

## 2020-05-06 ENCOUNTER — Emergency Department (HOSPITAL_COMMUNITY)
Admission: EM | Admit: 2020-05-06 | Discharge: 2020-05-06 | Disposition: A | Discharge status: Home / Self Care | Payer: HRSA Program | Attending: Emergency Medicine | Admitting: Emergency Medicine

## 2020-05-06 ENCOUNTER — Encounter (HOSPITAL_COMMUNITY): Payer: Self-pay

## 2020-05-06 DIAGNOSIS — R509 Fever, unspecified: Secondary | ICD-10-CM | POA: Diagnosis present

## 2020-05-06 DIAGNOSIS — F17210 Nicotine dependence, cigarettes, uncomplicated: Secondary | ICD-10-CM | POA: Diagnosis not present

## 2020-05-06 DIAGNOSIS — U071 COVID-19: Secondary | ICD-10-CM | POA: Insufficient documentation

## 2020-05-06 DIAGNOSIS — J45909 Unspecified asthma, uncomplicated: Secondary | ICD-10-CM | POA: Diagnosis not present

## 2020-05-06 DIAGNOSIS — B349 Viral infection, unspecified: Secondary | ICD-10-CM

## 2020-05-06 MED ORDER — BENZONATATE 100 MG PO CAPS: 100.0000 mg | ORAL_CAPSULE | Freq: Three times a day (TID) | ORAL | 0 refills | Status: AC

## 2020-05-06 MED ORDER — ALBUTEROL SULFATE HFA 108 (90 BASE) MCG/ACT IN AERS: 2.0000 | INHALATION_SPRAY | RESPIRATORY_TRACT | 0 refills | Status: AC | PRN

## 2020-05-06 MED ORDER — NAPROXEN 500 MG PO TABS
500.0000 mg | ORAL_TABLET | Freq: Two times a day (BID) | ORAL | 0 refills | Status: AC
Start: 2020-05-06 — End: ?

## 2020-05-06 NOTE — ED Provider Notes (Signed)
[facility] EMERGENCY DEPARTMENT Provider Note   CSN: [id number] Arrival date & time: 05/06/20  1531     History Chief Complaint  Patient presents with  . Fever    Nicole Moon is a 30 y.o. female.  HPI   Otherwise healthy 30 year old female, history of asthma presents with a complaint of some fever body aches shortness of breath, has a coworker who tested positive for COVID, symptoms are persistent, mild, not associated with vomiting or diarrhea, no medication given prior to arrival  Past Medical History:  Diagnosis Date  . Asthma   . Gestational diabetes    gestational diabetes  . HELLP syndrome   . Nausea 05/13/2014    Patient Active Problem List   Diagnosis Date Noted  . Amniotic fluid leaking 10/31/2014  . Gestational diabetes mellitus, class A1 09/07/2014  . History of gestational diabetes in prior pregnancy, currently pregnant 08/04/2014  . Susceptible to varicella (non-immune), currently pregnant 08/04/2014  . Rubella non-immune status, antepartum 08/04/2014  . Rh negative state in antepartum period 08/04/2014  . Supervision of normal pregnancy 05/13/2014  . Nausea 05/13/2014  . Headache 10/04/2012  . History of HELLP syndrome, currently pregnant 10/04/2012    Past Surgical History:  Procedure Laterality Date  . ANKLE SURGERY    . FRACTURE SURGERY    . TUBAL LIGATION       OB History    Gravida  4   Para  4   Term  2   Preterm  2   AB  0   Living  3     SAB  0   IAB  0   Ectopic  0   Multiple  0   Live Births  3           Family History  Problem Relation Age of Onset  . Heart disease Mother   . Heart disease Father   . Cancer Paternal Grandmother        cervical  . Heart attack Paternal Grandfather     Social History   Tobacco Use  . Smoking status: Current Every Day Smoker    Packs/day: 0.25    Types: Cigarettes  . Smokeless tobacco: Never Used  Substance Use Topics  . Alcohol use: Yes    Comment: occasionally    . Drug use: No    Home Medications Prior to Admission medications   Medication Sig Start Date End Date Taking? Authorizing Provider  albuterol (VENTOLIN HFA) 108 (90 Base) MCG/ACT inhaler Inhale 2 puffs into the lungs every 4 (four) hours as needed for wheezing or shortness of breath. 05/06/20  Yes Eber Hong, MD  benzonatate (TESSALON) 100 MG capsule Take 1 capsule (100 mg total) by mouth every 8 (eight) hours. 05/06/20  Yes Eber Hong, MD  naproxen (NAPROSYN) 500 MG tablet Take 1 tablet (500 mg total) by mouth 2 (two) times daily. 05/06/20  Yes Eber Hong, MD  acetaminophen (TYLENOL) 500 MG tablet Take 500 mg by mouth every 6 (six) hours as needed for mild pain or moderate pain.    [provider]  Phenylephrine-DM-GG-APAP 5-10-200-325 MG TABS Take 1-2 tablets by mouth every 6 (six) hours as needed.    [provider]    Allergies    Patient has no known allergies.  Review of Systems   Review of Systems  Constitutional: Positive for chills and fever.  HENT: Positive for sore throat.   Respiratory: Positive for cough and shortness of breath.   Musculoskeletal:  Positive for myalgias.    Physical Exam Updated Vital Signs BP (!) 137/93 (BP Location: Right Arm)   Pulse (!) 101   Temp 98.4 F (36.9 C) (Oral)   Resp 18   Ht 1.676 m (5\' 6" )   Wt 104.3 kg   LMP 04/07/2020   SpO2 98%   BMI 37.12 kg/m   Physical Exam Vitals and nursing note reviewed.  Constitutional:      Appearance: She is well-developed and well-nourished. She is not diaphoretic.  HENT:     Head: Normocephalic and atraumatic.  Eyes:     General:        Right eye: No discharge.        Left eye: No discharge.     Conjunctiva/sclera: Conjunctivae normal.  Pulmonary:     Effort: Pulmonary effort is normal. No respiratory distress.  Skin:    General: Skin is warm and dry.     Findings: No erythema or rash.  Neurological:     Mental Status: She is alert.     Coordination:  Coordination normal.  Psychiatric:        Mood and Affect: Mood and affect normal.     ED Results / Procedures / Treatments   Labs (all labs ordered are listed, but only abnormal results are displayed) Labs Reviewed  SARS CORONAVIRUS 2 (TAT 6-24 HRS)    EKG None  Radiology No results found.  Procedures Procedures (including critical care time)  Medications Ordered in ED Medications - No data to display  ED Course  I have reviewed the triage vital signs and the nursing notes.  Pertinent labs & imaging results that were available during my care of the patient were reviewed by me and considered in my medical decision making (see chart for details).    MDM Rules/Calculators/A&P                          Exam is unremarkable, vital signs show no fever hypotension or hypoxia, borderline tachycardia Likely has COVID-like illness, testing sent, no indication for x-ray Classic viral illness Patient agreeable, work note given Precautions discussed  Final Clinical Impression(s) / ED Diagnoses Final diagnoses:  Viral illness    Rx / DC Orders ED Discharge Orders         Ordered    albuterol (VENTOLIN HFA) 108 (90 Base) MCG/ACT inhaler  Every 4 hours PRN        05/06/20 1555    benzonatate (TESSALON) 100 MG capsule  Every 8 hours        05/06/20 1555    naproxen (NAPROSYN) 500 MG tablet  2 times daily        05/06/20 1555           05/08/20, MD 05/06/20 2128

## 2020-05-06 NOTE — ED Triage Notes (Signed)
Pt has coworker who tested positive for covid reports her symptoms started Tuesday. Reports fever, SOB , and body aches

## 2020-05-06 NOTE — ED Notes (Addendum)
Entered room and introduced self to patient. Pt appears to be resting in bed, respirations are even and unlabored with equal chest rise and fall. Bed is locked in the lowest position, side rails x2, call bell within reach. Pt educated on call light use and hourly rounding, verbalized understanding and in agreement at this time. All questions and concerns voiced addressed. Refreshments offered and provided per patient request.  

## 2020-05-06 NOTE — Discharge Instructions (Addendum)
Please isolate from others if your test is positive, your test will come back within 48 hours.  If you develop increasing coughing use albuterol and Tessalon for coughing and shortness of breath.  Naproxen twice a day for body aches, you will be contagious for 5 days from the first day of your illness, if you are getting better you do not need to quarantine any longer but can wear your mask in public.  Please be careful around others.

## 2020-05-07 LAB — SARS CORONAVIRUS 2 (TAT 6-24 HRS): SARS Coronavirus 2: POSITIVE — AB

## 2021-01-26 ENCOUNTER — Encounter (HOSPITAL_COMMUNITY): Payer: Self-pay

## 2021-01-26 ENCOUNTER — Other Ambulatory Visit: Payer: Self-pay

## 2021-01-26 ENCOUNTER — Emergency Department (HOSPITAL_COMMUNITY)
Admission: EM | Admit: 2021-01-26 | Discharge: 2021-01-26 | Disposition: A | Discharge status: Home / Self Care | Payer: Medicaid Other | Attending: Emergency Medicine | Admitting: Emergency Medicine

## 2021-01-26 DIAGNOSIS — M79631 Pain in right forearm: Secondary | ICD-10-CM | POA: Insufficient documentation

## 2021-01-26 DIAGNOSIS — J45909 Unspecified asthma, uncomplicated: Secondary | ICD-10-CM | POA: Insufficient documentation

## 2021-01-26 DIAGNOSIS — F17210 Nicotine dependence, cigarettes, uncomplicated: Secondary | ICD-10-CM | POA: Insufficient documentation

## 2021-01-26 DIAGNOSIS — M25511 Pain in right shoulder: Secondary | ICD-10-CM | POA: Insufficient documentation

## 2021-01-26 MED ORDER — PREDNISONE 10 MG PO TABS: 30.0000 mg | ORAL_TABLET | Freq: Every day | ORAL | 0 refills | Status: AC

## 2021-01-26 MED ORDER — CYCLOBENZAPRINE HCL 10 MG PO TABS: 10.0000 mg | ORAL_TABLET | Freq: Two times a day (BID) | ORAL | 0 refills | Status: AC | PRN

## 2021-01-26 NOTE — ED Triage Notes (Signed)
Right shoulder pain x 1 month, ~2 weeks ago pain now radiates down arm/neck/chest/back area. Ibuprofen without relief. No meds today. Intermit numbness in digits. +2 rad pulse, <3 sec cap refill

## 2021-01-26 NOTE — Discharge Instructions (Signed)
You have been seen here for right shoulder and forearm pain. I recommend taking over-the-counter pain medications like ibuprofen and/or Tylenol every 6 as needed.  Please follow dosage and on the back of bottle.  I also recommend applying heat to the area and stretching out the muscles as this will help decrease stiffness and pain.  I have given you information on exercises please follow.  I have also given you a prescription for a muscle relaxer this can make you drowsy do not consume alcohol or operate heavy machinery when taking this medication.   Please follow-up with the primary care provider that I have given you above.  Come back to the emergency department if you develop chest pain, shortness of breath, severe abdominal pain, uncontrolled nausea, vomiting, diarrhea.

## 2021-01-26 NOTE — ED Provider Notes (Signed)
[facility] EMERGENCY DEPARTMENT Provider Note   CSN: [id number] Arrival date & time: 01/26/21  1246     History Chief Complaint  Patient presents with   Shoulder Pain    Nicole Moon is a 30 y.o. female.  HPI  Patient with no significant medical history presents to the emergency department with chief complaint of right-sided shoulder pain.  Patient states she is been dealing with shoulder pain for the last few months but over the last week she is now developing pain that rating down her right forearm.  This concerned her and made her come in for further evaluation.  She states that her shoulder pain is intermittent, it is worsened after movement or with palpation, the pain will go away when it is in rest.  She notes that the pain radiates into her right forearm occurs mainly at nighttime, she describes as a restless arm leg syndrome, no associated paresthesias or weakness, nothing seems to help, she tried stretching, resting, over-the-counter medication not much relief.  She denies history of IV drug use, denies associated fevers, chills, chest pain, shortness of breath, worsening pedal edema, she has no significant cardiac history no history of PEs or DVTs.  Past Medical History:  Diagnosis Date   Asthma    Gestational diabetes    gestational diabetes   HELLP syndrome    Nausea 05/13/2014    Patient Active Problem List   Diagnosis Date Noted   Amniotic fluid leaking 10/31/2014   Gestational diabetes mellitus, class A1 09/07/2014   History of gestational diabetes in prior pregnancy, currently pregnant 08/04/2014   Susceptible to varicella (non-immune), currently pregnant 08/04/2014   Rubella non-immune status, antepartum 08/04/2014   Rh negative state in antepartum period 08/04/2014   Supervision of normal pregnancy 05/13/2014   Nausea 05/13/2014   Headache 10/04/2012   History of HELLP syndrome, currently pregnant 10/04/2012    Past Surgical History:  Procedure  Laterality Date   ANKLE SURGERY     FRACTURE SURGERY     TUBAL LIGATION       OB History     Gravida  4   Para  4   Term  2   Preterm  2   AB  0   Living  3      SAB  0   IAB  0   Ectopic  0   Multiple  0   Live Births  3           Family History  Problem Relation Age of Onset   Heart disease Mother    Heart disease Father    Cancer Paternal Grandmother        cervical   Heart attack Paternal Grandfather     Social History   Tobacco Use   Smoking status: Every Day    Packs/day: 0.25    Types: Cigarettes   Smokeless tobacco: Never  Substance Use Topics   Alcohol use: Yes    Comment: occasionally    Drug use: No    Home Medications Prior to Admission medications   Medication Sig Start Date End Date Taking? Authorizing Provider  cyclobenzaprine (FLEXERIL) 10 MG tablet Take 1 tablet (10 mg total) by mouth 2 (two) times daily as needed for muscle spasms. 01/26/21  Yes Carroll Sage, PA-C  predniSONE (DELTASONE) 10 MG tablet Take 3 tablets (30 mg total) by mouth daily for 5 days. 01/26/21 01/31/21 Yes Carroll Sage, PA-C  acetaminophen (TYLENOL) 500 MG tablet Take  500 mg by mouth every 6 (six) hours as needed for mild pain or moderate pain.    [provider]  albuterol (VENTOLIN HFA) 108 (90 Base) MCG/ACT inhaler Inhale 2 puffs into the lungs every 4 (four) hours as needed for wheezing or shortness of breath. 05/06/20   Eber Hong, MD  benzonatate (TESSALON) 100 MG capsule Take 1 capsule (100 mg total) by mouth every 8 (eight) hours. 05/06/20   Eber Hong, MD  naproxen (NAPROSYN) 500 MG tablet Take 1 tablet (500 mg total) by mouth 2 (two) times daily. 05/06/20   Eber Hong, MD  Phenylephrine-DM-GG-APAP 5-10-200-325 MG TABS Take 1-2 tablets by mouth every 6 (six) hours as needed.    [provider]    Allergies    Patient has no known allergies.  Review of Systems   Review of Systems  Constitutional:  Negative for  chills and fever.  HENT:  Negative for congestion.   Respiratory:  Negative for shortness of breath.   Cardiovascular:  Negative for chest pain.  Gastrointestinal:  Negative for abdominal pain.  Genitourinary:  Negative for enuresis.  Musculoskeletal:  Negative for back pain.       Right shoulder pain and right forearm pain.  Skin:  Negative for rash.  Neurological:  Negative for dizziness.  Hematological:  Does not bruise/bleed easily.   Physical Exam Updated Vital Signs BP (!) 148/81   Pulse 80   Temp 98.5 F (36.9 C) (Oral)   Resp 16   Ht 5\' 5"  (1.651 m)   Wt 99.8 kg   LMP 12/29/2020 (Approximate)   SpO2 100%   BMI 36.61 kg/m   Physical Exam Vitals and nursing note reviewed.  Constitutional:      General: She is not in acute distress.    Appearance: She is not ill-appearing.  HENT:     Head: Normocephalic and atraumatic.     Nose: No congestion.  Eyes:     Conjunctiva/sclera: Conjunctivae normal.  Cardiovascular:     Rate and Rhythm: Normal rate and regular rhythm.     Pulses: Normal pulses.     Heart sounds: No murmur heard.   No friction rub. No gallop.  Pulmonary:     Effort: No respiratory distress.     Breath sounds: No wheezing, rhonchi or rales.  Musculoskeletal:     Comments: Patient has noted right-sided shoulder pain, pain is located over her [facility] joint, no crepitus deformities with passive range of motion, she has full range of motion at her shoulder elbow wrist and fingers.  Neurovascularly fully intact.  She also has some tenderness along the radius no deformities noted.  Patient has noted equal grip strengths.  Skin:    General: Skin is warm and dry.  Neurological:     Mental Status: She is alert.     Comments: No facial asymmetry, no difficult word finding, able to follow two-step commands.  Psychiatric:        Mood and Affect: Mood normal.    ED Results / Procedures / Treatments   Labs (all labs ordered are listed, but only abnormal results  are displayed) Labs Reviewed - No data to display  EKG EKG Interpretation  Date/Time:  Wednesday January 26 2021 14:18:00 EDT Ventricular Rate:  82 PR Interval:  136 QRS Duration: 86 QT Interval:  392 QTC Calculation: 457 R Axis:   67 Text Interpretation: Normal sinus rhythm Normal ECG Confirmed by [id number] (Cathren Laine) on 01/26/2021 2:25:27 PM  Radiology No  results found.  Procedures Procedures   Medications Ordered in ED Medications - No data to display  ED Course  I have reviewed the triage vital signs and the nursing notes.  Pertinent labs & imaging results that were available during my care of the patient were reviewed by me and considered in my medical decision making (see chart for details).    MDM Rules/Calculators/A&P                          Initial impression-patient presented withRight-sided shoulder pain and forearm pain.  She is alert, does not appear acute stress, vital signs are reassuring.  Work-up-due to well-appearing patient, benign physical exam, further lab work and imaging not warranted at this time.  Rule out-I have low suspicion for septic arthritis as patient denies IV drug use, skin exam was performed no erythematous, edematous, warm joints noted on exam, no new heart murmur heard on exam.  Imaging will be deferred as there is no traumatic injury associated with this pain, low suspicion for fractures or dislocation. low suspicion for ligament or tendon damage as area was palpated no gross defects noted, they had full range of motion as well as 5/5 strength.  Low suspicion for compartment syndrome as area was palpated it was soft to the touch, neurovascular fully intact.  Low suspicion for ACS as EKG sinus without signs of ischemia.   Plan-  Right shoulder pain and forearm pain-likely patient setting from acute on chronic right shoulder pain with radiating pain to right forearm.  will start her on Flexeril, short course of steroids, follow-up with  PCP for further evaluation.  Vital signs have remained stable, no indication for hospital admission.    Patient given at home care as well strict return precautions.  Patient verbalized that they understood agreed to said plan.  Final Clinical Impression(s) / ED Diagnoses Final diagnoses:  Acute pain of right shoulder    Rx / DC Orders ED Discharge Orders          Ordered    cyclobenzaprine (FLEXERIL) 10 MG tablet  2 times daily PRN        01/26/21 1434    predniSONE (DELTASONE) 10 MG tablet  Daily        01/26/21 1434             Barnie Del 01/26/21 1438    Cathren Laine, MD 01/26/21 1515

## 2021-02-16 ENCOUNTER — Emergency Department (HOSPITAL_COMMUNITY): Admission: EM | Admit: 2021-02-16 | Discharge: 2021-02-16 | Discharge status: Against Medical Advice | Payer: Self-pay

## 2021-02-16 ENCOUNTER — Emergency Department (HOSPITAL_COMMUNITY)
Admission: EM | Admit: 2021-02-16 | Discharge: 2021-02-17 | Disposition: A | Discharge status: Home / Self Care | Payer: Medicaid Other | Attending: Emergency Medicine | Admitting: Emergency Medicine

## 2021-02-16 ENCOUNTER — Emergency Department (HOSPITAL_COMMUNITY): Payer: Medicaid Other

## 2021-02-16 ENCOUNTER — Other Ambulatory Visit: Payer: Self-pay

## 2021-02-16 DIAGNOSIS — K2900 Acute gastritis without bleeding: Secondary | ICD-10-CM | POA: Insufficient documentation

## 2021-02-16 DIAGNOSIS — F17210 Nicotine dependence, cigarettes, uncomplicated: Secondary | ICD-10-CM | POA: Insufficient documentation

## 2021-02-16 DIAGNOSIS — J45909 Unspecified asthma, uncomplicated: Secondary | ICD-10-CM | POA: Insufficient documentation

## 2021-02-16 DIAGNOSIS — R079 Chest pain, unspecified: Secondary | ICD-10-CM

## 2021-02-16 LAB — CBC
HCT: 41.7 % (ref 36.0–46.0)
Hemoglobin: 13.3 g/dL (ref 12.0–15.0)
MCH: 29.8 pg (ref 26.0–34.0)
MCHC: 31.9 g/dL (ref 30.0–36.0)
MCV: 93.5 fL (ref 80.0–100.0)
Platelets: 182 10*3/uL (ref 150–400)
RBC: 4.46 MIL/uL (ref 3.87–5.11)
RDW: 13.7 % (ref 11.5–15.5)
WBC: 11.6 10*3/uL — ABNORMAL HIGH (ref 4.0–10.5)
nRBC: 0 % (ref 0.0–0.2)

## 2021-02-16 LAB — BASIC METABOLIC PANEL
Anion gap: 8 (ref 5–15)
BUN: 8 mg/dL (ref 6–20)
CO2: 26 mmol/L (ref 22–32)
Calcium: 8.9 mg/dL (ref 8.9–10.3)
Chloride: 102 mmol/L (ref 98–111)
Creatinine, Ser: 0.77 mg/dL (ref 0.44–1.00)
GFR, Estimated: >60 mL/min (ref >60)
Glucose, Bld: 92 mg/dL (ref 70–99)
Potassium: 3.5 mmol/L (ref 3.5–5.1)
Sodium: 136 mmol/L (ref 135–145)

## 2021-02-16 LAB — TROPONIN I (HIGH SENSITIVITY): Troponin I (High Sensitivity): <2 ng/L (ref <18)

## 2021-02-16 NOTE — ED Triage Notes (Signed)
Pt states she has had chest pain since Sunday, has been getting worse over the last few days. Pt thought this was gas related but has not eased up. States pain is in center of chest.

## 2021-02-16 NOTE — ED Notes (Signed)
x2

## 2021-02-16 NOTE — ED Notes (Signed)
Called for triage vitals x1, no answer

## 2021-02-17 ENCOUNTER — Encounter (INDEPENDENT_AMBULATORY_CARE_PROVIDER_SITE_OTHER): Payer: Self-pay | Admitting: *Deleted

## 2021-02-17 ENCOUNTER — Other Ambulatory Visit: Payer: Self-pay

## 2021-02-17 LAB — TROPONIN I (HIGH SENSITIVITY): Troponin I (High Sensitivity): <2 ng/L (ref <18)

## 2021-02-17 MED ORDER — LIDOCAINE VISCOUS HCL 2 % MT SOLN
15.0000 mL | Freq: Once | OROMUCOSAL | Status: AC
  Administered 2021-02-17: 15 mL via ORAL
  Filled 2021-02-17: qty 15

## 2021-02-17 MED ORDER — ALUM & MAG HYDROXIDE-SIMETH 200-200-20 MG/5ML PO SUSP
30.0000 mL | Freq: Once | ORAL | Status: AC
  Administered 2021-02-17: 30 mL via ORAL
  Filled 2021-02-17: qty 30

## 2021-02-17 MED ORDER — SUCRALFATE 1 G PO TABS: 1.0000 g | ORAL_TABLET | Freq: Three times a day (TID) | ORAL | 0 refills | Status: AC

## 2021-02-17 MED ORDER — PANTOPRAZOLE SODIUM 20 MG PO TBEC: 20.0000 mg | DELAYED_RELEASE_TABLET | Freq: Every day | ORAL | 0 refills | Status: AC

## 2021-02-17 MED ORDER — PANTOPRAZOLE SODIUM 40 MG PO TBEC
80.0000 mg | DELAYED_RELEASE_TABLET | Freq: Every day | ORAL | Status: DC
  Administered 2021-02-17: 80 mg via ORAL
  Filled 2021-02-17: qty 2

## 2021-02-17 NOTE — ED Provider Notes (Signed)
[facility] EMERGENCY DEPARTMENT Provider Note   CSN: [id number] Arrival date & time: 02/16/21  1929     History Chief Complaint  Patient presents with   Chest Pain    Nicole Moon is a 30 y.o. female.   Chest Pain Pain location:  Substernal area and epigastric Pain quality: aching and sharp   Pain radiates to:  Does not radiate Pain severity:  Mild Onset quality:  Gradual Timing:  Constant Progression:  Worsening Chronicity:  New Context: not breathing and not intercourse       Past Medical History:  Diagnosis Date   Asthma    Gestational diabetes    gestational diabetes   HELLP syndrome    Nausea 05/13/2014    Patient Active Problem List   Diagnosis Date Noted   Amniotic fluid leaking 10/31/2014   Gestational diabetes mellitus, class A1 09/07/2014   History of gestational diabetes in prior pregnancy, currently pregnant 08/04/2014   Susceptible to varicella (non-immune), currently pregnant 08/04/2014   Rubella non-immune status, antepartum 08/04/2014   Rh negative state in antepartum period 08/04/2014   Supervision of normal pregnancy 05/13/2014   Nausea 05/13/2014   Headache 10/04/2012   History of HELLP syndrome, currently pregnant 10/04/2012    Past Surgical History:  Procedure Laterality Date   ANKLE SURGERY     FRACTURE SURGERY     TUBAL LIGATION       OB History     Gravida  4   Para  4   Term  2   Preterm  2   AB  0   Living  3      SAB  0   IAB  0   Ectopic  0   Multiple  0   Live Births  3           Family History  Problem Relation Age of Onset   Heart disease Mother    Heart disease Father    Cancer Paternal Grandmother        cervical   Heart attack Paternal Grandfather     Social History   Tobacco Use   Smoking status: Every Day    Packs/day: 0.25    Types: Cigarettes   Smokeless tobacco: Never  Substance Use Topics   Alcohol use: Yes    Comment: occasionally    Drug use: No    Home  Medications Prior to Admission medications   Medication Sig Start Date End Date Taking? Authorizing Provider  pantoprazole (PROTONIX) 20 MG tablet Take 1 tablet (20 mg total) by mouth daily. 02/17/21  Yes Philip Eckersley, Barbara Cower, MD  sucralfate (CARAFATE) 1 g tablet Take 1 tablet (1 g total) by mouth 4 (four) times daily -  with meals and at bedtime for 7 days. 02/17/21 02/24/21 Yes Helton Oleson, Barbara Cower, MD  acetaminophen (TYLENOL) 500 MG tablet Take 500 mg by mouth every 6 (six) hours as needed for mild pain or moderate pain.    [provider]  albuterol (VENTOLIN HFA) 108 (90 Base) MCG/ACT inhaler Inhale 2 puffs into the lungs every 4 (four) hours as needed for wheezing or shortness of breath. 05/06/20   Eber Hong, MD  benzonatate (TESSALON) 100 MG capsule Take 1 capsule (100 mg total) by mouth every 8 (eight) hours. 05/06/20   Eber Hong, MD  cyclobenzaprine (FLEXERIL) 10 MG tablet Take 1 tablet (10 mg total) by mouth 2 (two) times daily as needed for muscle spasms. 01/26/21   Carroll Sage, PA-C  naproxen (  NAPROSYN) 500 MG tablet Take 1 tablet (500 mg total) by mouth 2 (two) times daily. 05/06/20   Eber Hong, MD  Phenylephrine-DM-GG-APAP 5-10-200-325 MG TABS Take 1-2 tablets by mouth every 6 (six) hours as needed.    [provider]    Allergies    Patient has no known allergies.  Review of Systems   Review of Systems  Cardiovascular:  Positive for chest pain.  All other systems reviewed and are negative.  Physical Exam Updated Vital Signs BP (!) 99/51   Pulse 88   Temp 98.4 F (36.9 C) (Oral)   Resp (!) 26   Ht 5\' 6"  (1.676 m)   Wt 99.8 kg   LMP 02/16/2021 (Exact Date)   SpO2 98%   BMI 35.51 kg/m   Physical Exam Vitals and nursing note reviewed.  Constitutional:      Appearance: She is well-developed.  HENT:     Head: Normocephalic and atraumatic.  Cardiovascular:     Rate and Rhythm: Normal rate and regular rhythm.  Pulmonary:     Effort: Pulmonary  effort is normal. No tachypnea or respiratory distress.     Breath sounds: No stridor.  Abdominal:     General: There is no distension.     Palpations: Abdomen is soft.  Musculoskeletal:        General: Normal range of motion.     Cervical back: Normal range of motion.     Right lower leg: No edema.     Left lower leg: No edema.  Skin:    General: Skin is warm and dry.  Neurological:     Mental Status: She is alert.    ED Results / Procedures / Treatments   Labs (all labs ordered are listed, but only abnormal results are displayed) Labs Reviewed  CBC - Abnormal; Notable for the following components:      Result Value   WBC 11.6 (*)    All other components within normal limits  BASIC METABOLIC PANEL  POC URINE PREG, ED  TROPONIN I (HIGH SENSITIVITY)  TROPONIN I (HIGH SENSITIVITY)    EKG None  Radiology DG Chest 2 View  Result Date: 02/16/2021 CLINICAL DATA:  Chest pain. EXAM: CHEST - 2 VIEW COMPARISON:  Chest radiograph dated 08/07/2019. FINDINGS: The heart size and mediastinal contours are within normal limits. Both lungs are clear. The visualized skeletal structures are unremarkable. IMPRESSION: No active cardiopulmonary disease. Electronically Signed   By: 08/09/2019 M.D.   On: 02/16/2021 22:38    Procedures Procedures   Medications Ordered in ED Medications  pantoprazole (PROTONIX) EC tablet 80 mg (80 mg Oral Given 02/17/21 0113)  alum & mag hydroxide-simeth (MAALOX/MYLANTA) 200-200-20 MG/5ML suspension 30 mL (30 mLs Oral Given 02/17/21 0113)    And  lidocaine (XYLOCAINE) 2 % viscous mouth solution 15 mL (15 mLs Oral Given 02/17/21 0113)    ED Course  I have reviewed the triage vital signs and the nursing notes.  Pertinent labs & imaging results that were available during my care of the patient were reviewed by me and considered in my medical decision making (see chart for details).    MDM Rules/Calculators/A&P                         Suspect likely  GERD/indigestion. Doubt acs, pe or other acute causes.   Final Clinical Impression(s) / ED Diagnoses Final diagnoses:  Nonspecific chest pain  Acute gastritis without hemorrhage, unspecified gastritis  type    Rx / DC Orders ED Discharge Orders          Ordered    pantoprazole (PROTONIX) 20 MG tablet  Daily        02/17/21 0204    sucralfate (CARAFATE) 1 g tablet  3 times daily with meals & bedtime        02/17/21 0204    Ambulatory referral to Gastroenterology        02/17/21 0204             Fianna Snowball, Barbara Cower, MD 02/17/21 [phone]

## 2021-06-23 ENCOUNTER — Encounter (INDEPENDENT_AMBULATORY_CARE_PROVIDER_SITE_OTHER): Payer: Self-pay | Admitting: Gastroenterology

## 2021-06-23 ENCOUNTER — Ambulatory Visit (INDEPENDENT_AMBULATORY_CARE_PROVIDER_SITE_OTHER): Payer: Medicaid Other | Admitting: Gastroenterology

## 2023-04-20 IMAGING — DX DG CHEST 2V
2 series · 2 of 2 positions shown · non-contrast
Comparison: Chest radiograph dated 08/07/2019.

CLINICAL DATA: Chest pain.

EXAM:
CHEST - 2 VIEW

[chest pa]
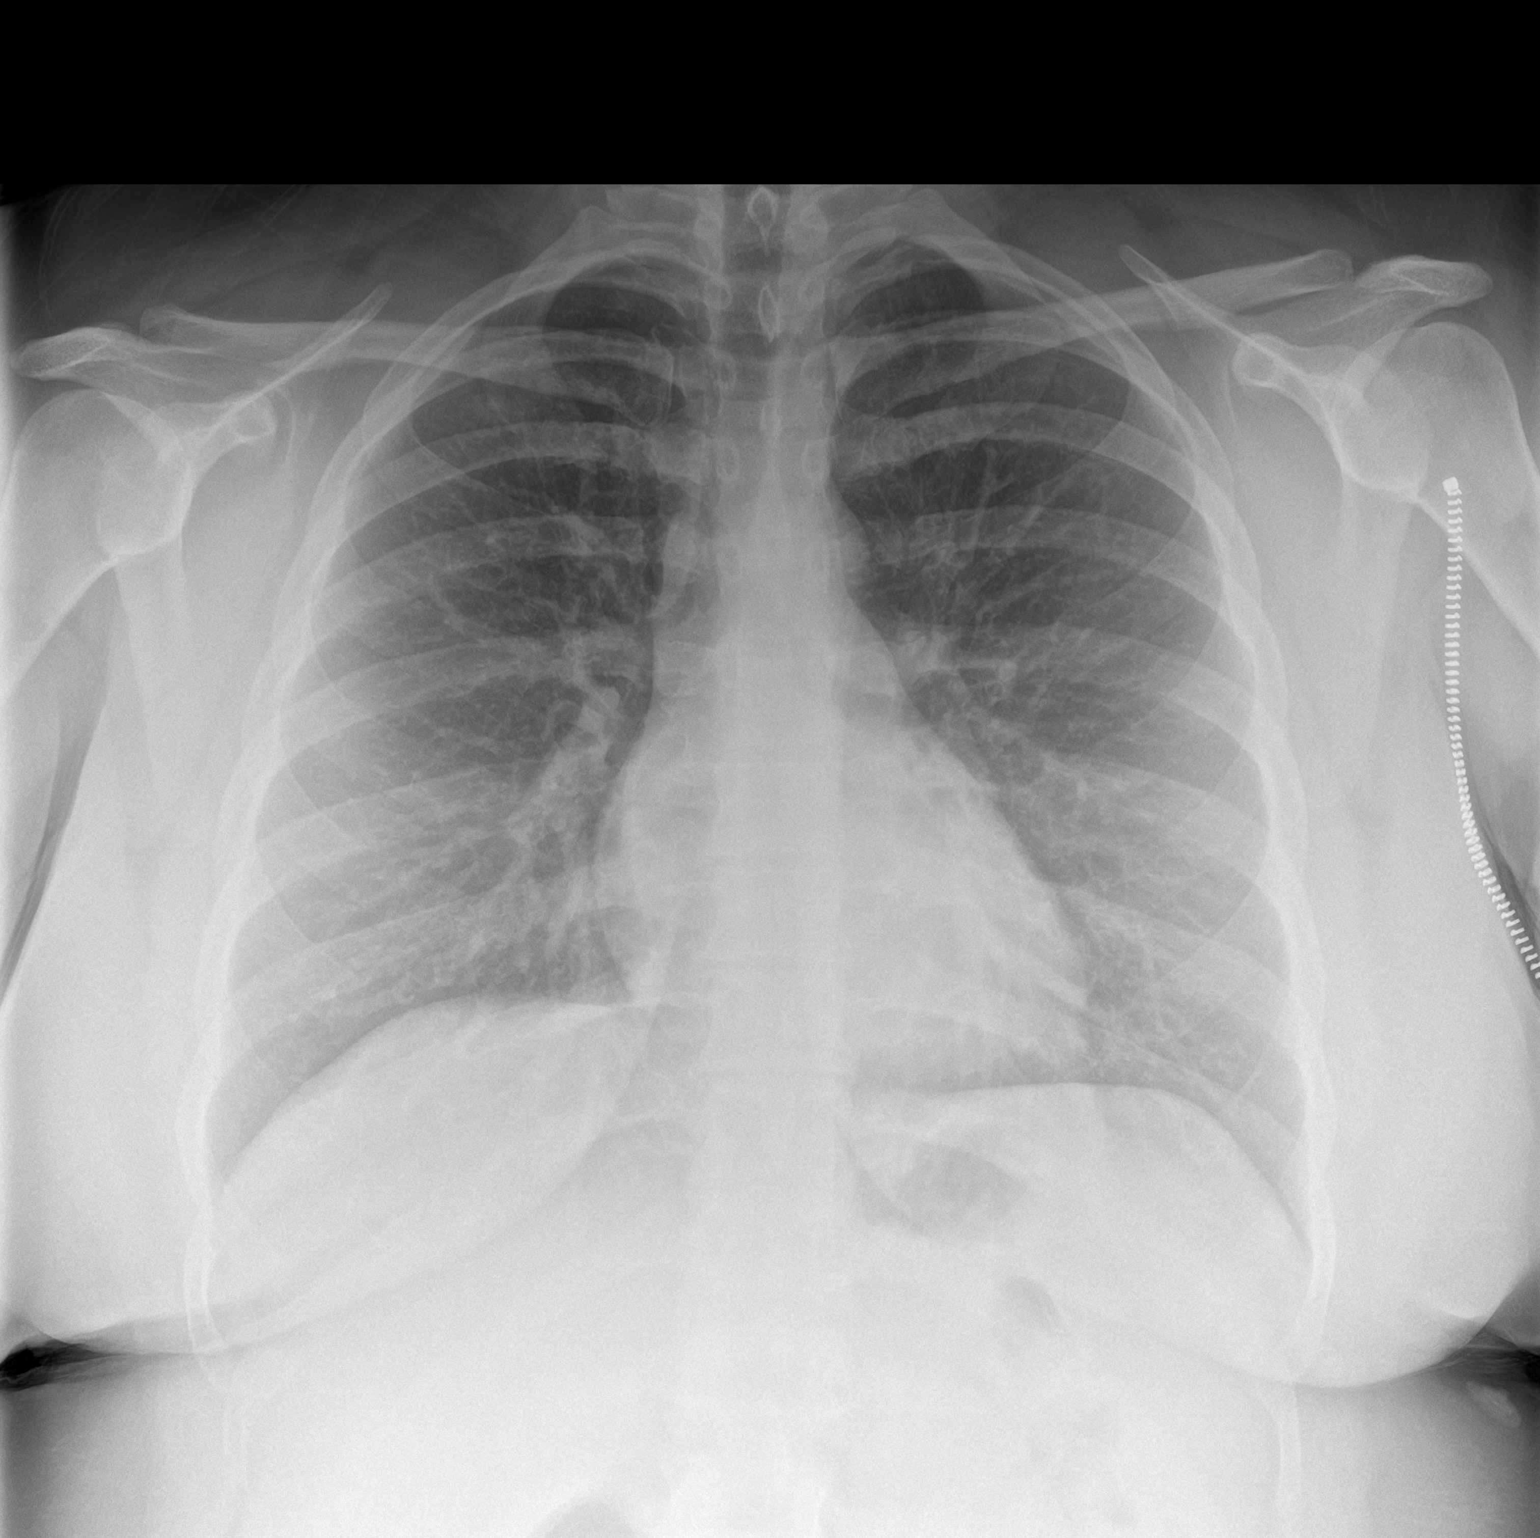

[chest lat]
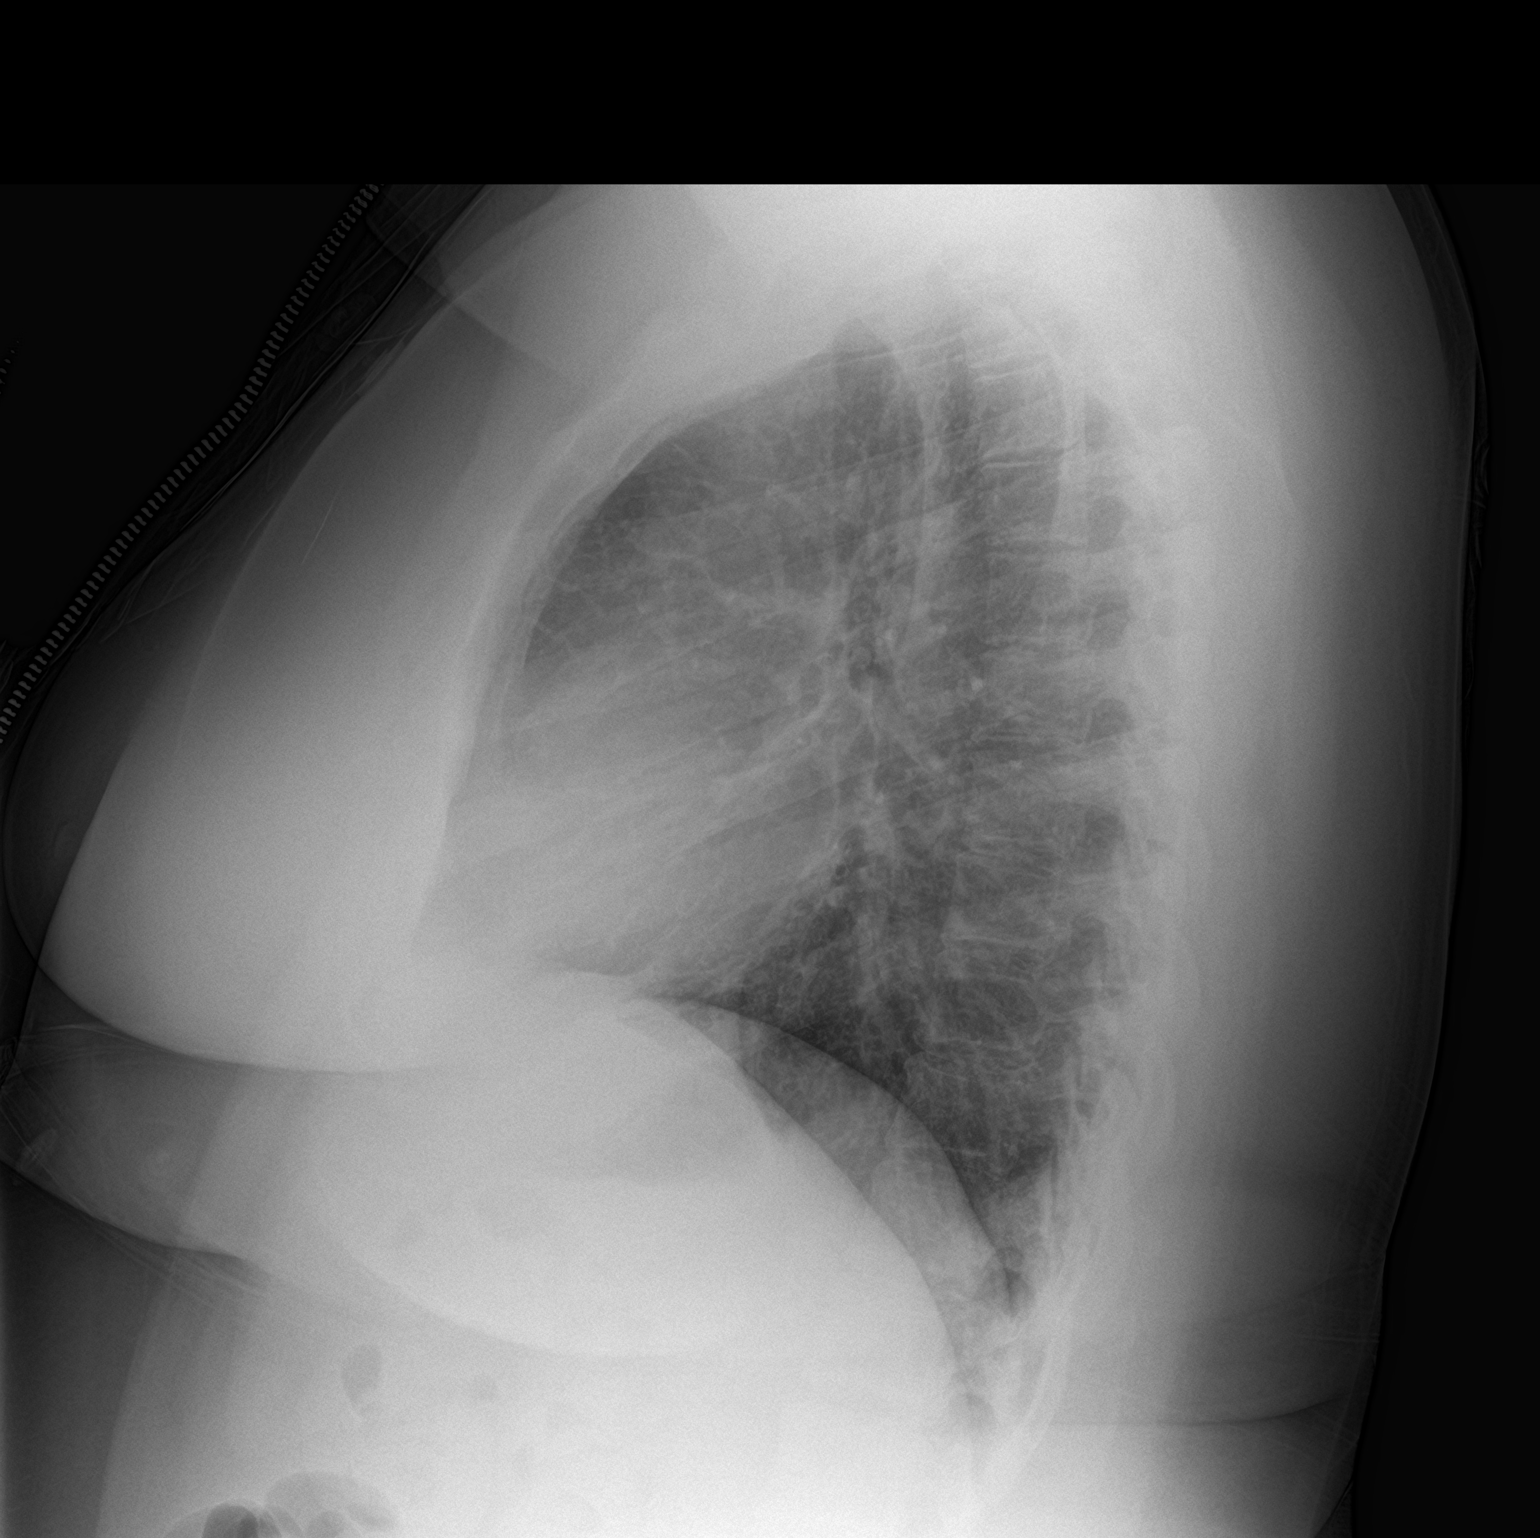

[2 of 2 positions shown; findings below may reference images not displayed]

FINDINGS: The heart size and mediastinal contours are within normal limits.
Both lungs are clear. The visualized skeletal structures are
unremarkable.
IMPRESSION: No active cardiopulmonary disease.

## 2023-05-28 ENCOUNTER — Emergency Department (HOSPITAL_COMMUNITY)
Admission: EM | Admit: 2023-05-28 | Discharge: 2023-05-28 | Discharge status: Against Medical Advice | Payer: Medicaid Other

## 2023-05-28 NOTE — ED Notes (Signed)
Pt called x2 times with no answer
